# Patient Record
Sex: Female | Born: 1935 | Race: White | Hispanic: No | Smoking: Never smoker
Health system: Southern US, Community
[De-identification: ages and names within clinical notes are randomized; demographics above are authoritative.]

## PROBLEM LIST (undated history)

## (undated) DIAGNOSIS — M1990 Unspecified osteoarthritis, unspecified site: Secondary | ICD-10-CM

## (undated) DIAGNOSIS — Z87898 Personal history of other specified conditions: Secondary | ICD-10-CM

## (undated) DIAGNOSIS — J45909 Unspecified asthma, uncomplicated: Secondary | ICD-10-CM

## (undated) DIAGNOSIS — I499 Cardiac arrhythmia, unspecified: Secondary | ICD-10-CM

## (undated) DIAGNOSIS — H409 Unspecified glaucoma: Secondary | ICD-10-CM

## (undated) DIAGNOSIS — Z8619 Personal history of other infectious and parasitic diseases: Secondary | ICD-10-CM

## (undated) DIAGNOSIS — F419 Anxiety disorder, unspecified: Secondary | ICD-10-CM

## (undated) DIAGNOSIS — T7840XA Allergy, unspecified, initial encounter: Secondary | ICD-10-CM

## (undated) DIAGNOSIS — E785 Hyperlipidemia, unspecified: Secondary | ICD-10-CM

## (undated) DIAGNOSIS — K219 Gastro-esophageal reflux disease without esophagitis: Secondary | ICD-10-CM

## (undated) DIAGNOSIS — IMO0001 Reserved for inherently not codable concepts without codable children: Secondary | ICD-10-CM

## (undated) DIAGNOSIS — E7800 Pure hypercholesterolemia, unspecified: Secondary | ICD-10-CM

## (undated) DIAGNOSIS — G43909 Migraine, unspecified, not intractable, without status migrainosus: Secondary | ICD-10-CM

## (undated) HISTORY — DX: Allergy, unspecified, initial encounter: T78.40XA

## (undated) HISTORY — DX: Cardiac arrhythmia, unspecified: I49.9

## (undated) HISTORY — DX: Pure hypercholesterolemia, unspecified: E78.00

## (undated) HISTORY — PX: ABDOMINAL HYSTERECTOMY: SHX81

## (undated) HISTORY — DX: Hyperlipidemia, unspecified: E78.5

## (undated) HISTORY — DX: Unspecified asthma, uncomplicated: J45.909

## (undated) HISTORY — PX: TONSILLECTOMY: SUR1361

## (undated) HISTORY — DX: Gastro-esophageal reflux disease without esophagitis: K21.9

## (undated) HISTORY — DX: Unspecified osteoarthritis, unspecified site: M19.90

## (undated) HISTORY — DX: Anxiety disorder, unspecified: F41.9

## (undated) HISTORY — DX: Unspecified glaucoma: H40.9

## (undated) HISTORY — DX: Migraine, unspecified, not intractable, without status migrainosus: G43.909

## (undated) HISTORY — DX: Personal history of other specified conditions: Z87.898

## (undated) HISTORY — PX: GALLBLADDER SURGERY: SHX652

## (undated) HISTORY — DX: Reserved for inherently not codable concepts without codable children: IMO0001

---

## 2004-08-01 ENCOUNTER — Ambulatory Visit: Payer: Self-pay | Admitting: Unknown Physician Specialty

## 2004-08-04 ENCOUNTER — Ambulatory Visit: Payer: Self-pay | Admitting: Unknown Physician Specialty

## 2005-02-06 ENCOUNTER — Ambulatory Visit: Payer: Self-pay | Admitting: General Surgery

## 2005-08-14 ENCOUNTER — Ambulatory Visit: Payer: Self-pay | Admitting: General Surgery

## 2006-08-23 ENCOUNTER — Ambulatory Visit: Payer: Self-pay | Admitting: Unknown Physician Specialty

## 2006-11-29 ENCOUNTER — Ambulatory Visit: Payer: Self-pay | Admitting: Unknown Physician Specialty

## 2008-03-04 ENCOUNTER — Ambulatory Visit: Payer: Self-pay | Admitting: Unknown Physician Specialty

## 2008-05-11 ENCOUNTER — Other Ambulatory Visit: Payer: Self-pay

## 2008-05-11 ENCOUNTER — Emergency Department: Payer: Self-pay | Admitting: Internal Medicine

## 2009-05-17 ENCOUNTER — Ambulatory Visit: Payer: Self-pay | Admitting: Internal Medicine

## 2010-06-09 ENCOUNTER — Ambulatory Visit: Payer: Self-pay | Admitting: Internal Medicine

## 2011-07-05 ENCOUNTER — Ambulatory Visit: Payer: Self-pay | Admitting: Internal Medicine

## 2012-08-06 ENCOUNTER — Ambulatory Visit: Payer: Self-pay | Admitting: Internal Medicine

## 2014-04-06 ENCOUNTER — Ambulatory Visit: Payer: Self-pay | Admitting: Physician Assistant

## 2015-07-09 DIAGNOSIS — Z91040 Latex allergy status: Secondary | ICD-10-CM | POA: Insufficient documentation

## 2015-07-09 DIAGNOSIS — Z880 Allergy status to penicillin: Secondary | ICD-10-CM | POA: Diagnosis not present

## 2015-07-09 DIAGNOSIS — R197 Diarrhea, unspecified: Secondary | ICD-10-CM | POA: Insufficient documentation

## 2015-07-09 DIAGNOSIS — R112 Nausea with vomiting, unspecified: Secondary | ICD-10-CM | POA: Diagnosis not present

## 2015-07-09 DIAGNOSIS — R109 Unspecified abdominal pain: Secondary | ICD-10-CM | POA: Diagnosis not present

## 2015-07-09 DIAGNOSIS — Z79899 Other long term (current) drug therapy: Secondary | ICD-10-CM | POA: Diagnosis not present

## 2015-07-09 DIAGNOSIS — Z792 Long term (current) use of antibiotics: Secondary | ICD-10-CM | POA: Insufficient documentation

## 2015-07-09 LAB — CBC
HEMATOCRIT: 40.9 % (ref 35.0–47.0)
Hemoglobin: 13.9 g/dL (ref 12.0–16.0)
MCH: 31.4 pg (ref 26.0–34.0)
MCHC: 33.9 g/dL (ref 32.0–36.0)
MCV: 92.5 fL (ref 80.0–100.0)
Platelets: 257 10*3/uL (ref 150–440)
RBC: 4.43 MIL/uL (ref 3.80–5.20)
RDW: 13.6 % (ref 11.5–14.5)
WBC: 9.7 10*3/uL (ref 3.6–11.0)

## 2015-07-09 LAB — TROPONIN I: Troponin I: <0.03 ng/mL (ref <0.031)

## 2015-07-09 LAB — COMPREHENSIVE METABOLIC PANEL
ALBUMIN: 3.8 g/dL (ref 3.5–5.0)
ALT: 23 U/L (ref 14–54)
ANION GAP: 7 (ref 5–15)
AST: 25 U/L (ref 15–41)
Alkaline Phosphatase: 32 U/L — ABNORMAL LOW (ref 38–126)
BUN: 10 mg/dL (ref 6–20)
CO2: 24 mmol/L (ref 22–32)
Calcium: 9.1 mg/dL (ref 8.9–10.3)
Chloride: 107 mmol/L (ref 101–111)
Creatinine, Ser: 0.82 mg/dL (ref 0.44–1.00)
GFR calc non Af Amer: >60 mL/min (ref >60)
GLUCOSE: 109 mg/dL — AB (ref 65–99)
POTASSIUM: 3.3 mmol/L — AB (ref 3.5–5.1)
SODIUM: 138 mmol/L (ref 135–145)
Total Bilirubin: 0.8 mg/dL (ref 0.3–1.2)
Total Protein: 6.9 g/dL (ref 6.5–8.1)

## 2015-07-09 LAB — LIPASE, BLOOD: Lipase: 24 U/L (ref 22–51)

## 2015-07-09 NOTE — ED Notes (Signed)
Patient reports treated for same symptoms 2 weeks ago and woke last night with abdominal pain with nausea, vomiting and diarrhea.

## 2015-07-10 ENCOUNTER — Encounter: Payer: Self-pay | Admitting: Emergency Medicine

## 2015-07-10 ENCOUNTER — Emergency Department
Admission: EM | Admit: 2015-07-10 | Discharge: 2015-07-10 | Disposition: A | Discharge status: Home / Self Care | Payer: Medicare Other | Attending: Emergency Medicine | Admitting: Emergency Medicine

## 2015-07-10 DIAGNOSIS — R197 Diarrhea, unspecified: Secondary | ICD-10-CM

## 2015-07-10 HISTORY — DX: Personal history of other infectious and parasitic diseases: Z86.19

## 2015-07-10 LAB — C DIFFICILE QUICK SCREEN W PCR REFLEX
C DIFFICILE (CDIFF) TOXIN: POSITIVE — AB
C Diff antigen: POSITIVE — AB
C Diff interpretation: POSITIVE

## 2015-07-10 MED ORDER — POTASSIUM CHLORIDE CRYS ER 20 MEQ PO TBCR
40.0000 meq | EXTENDED_RELEASE_TABLET | Freq: Once | ORAL | Status: AC
  Administered 2015-07-10: 40 meq via ORAL
  Filled 2015-07-10: qty 2

## 2015-07-10 NOTE — Discharge Instructions (Signed)
As we discussed, we are very concerned about the possibility of a recurrent or previously incompletely treated C. difficile infection.  However, since you were not able to produce a stool sample while in the emergency department and you are feeling well with normal vital signs, we will discharge to home for outpatient follow-up.  We provided a written order for outpatient C. difficile testing.  Please take a sample to the lab and have it tested as soon as possible.  We recommend that you follow-up with Dr. Ouida Sills at the next available opportunity.  Please return immediately to the emergency department if you develop any new or worsening symptoms that concern you.   Diarrhea Diarrhea is frequent loose and watery bowel movements. It can cause you to feel weak and dehydrated. Dehydration can cause you to become tired and thirsty, have a dry mouth, and have decreased urination that often is dark yellow. Diarrhea is a sign of another problem, most often an infection that will not last long. In most cases, diarrhea typically lasts 2-3 days. However, it can last longer if it is a sign of something more serious. It is important to treat your diarrhea as directed by your caregiver to lessen or prevent future episodes of diarrhea. CAUSES  Some common causes include:  Gastrointestinal infections caused by viruses, bacteria, or parasites.  Food poisoning or food allergies.  Certain medicines, such as antibiotics, chemotherapy, and laxatives.  Artificial sweeteners and fructose.  Digestive disorders. HOME CARE INSTRUCTIONS  Ensure adequate fluid intake (hydration): Have 1 cup (8 oz) of fluid for each diarrhea episode. Avoid fluids that contain simple sugars or sports drinks, fruit juices, whole milk products, and sodas. Your urine should be clear or pale yellow if you are drinking enough fluids. Hydrate with an oral rehydration solution that you can purchase at pharmacies, retail stores, and online. You can  prepare an oral rehydration solution at home by mixing the following ingredients together:   - tsp table salt.   tsp baking soda.   tsp salt substitute containing potassium chloride.  1  tablespoons sugar.  1 L (34 oz) of water.  Certain foods and beverages may increase the speed at which food moves through the gastrointestinal (GI) tract. These foods and beverages should be avoided and include:  Caffeinated and alcoholic beverages.  High-fiber foods, such as raw fruits and vegetables, nuts, seeds, and whole grain breads and cereals.  Foods and beverages sweetened with sugar alcohols, such as xylitol, sorbitol, and mannitol.  Some foods may be well tolerated and may help thicken stool including:  Starchy foods, such as rice, toast, pasta, low-sugar cereal, oatmeal, grits, baked potatoes, crackers, and bagels.  Bananas.  Applesauce.  Add probiotic-rich foods to help increase healthy bacteria in the GI tract, such as yogurt and fermented milk products.  Wash your hands well after each diarrhea episode.  Only take over-the-counter or prescription medicines as directed by your caregiver.  Take a warm bath to relieve any burning or pain from frequent diarrhea episodes. SEEK IMMEDIATE MEDICAL CARE IF:   You are unable to keep fluids down.  You have persistent vomiting.  You have blood in your stool, or your stools are black and tarry.  You do not urinate in 6-8 hours, or there is only a small amount of very dark urine.  You have abdominal pain that increases or localizes.  You have weakness, dizziness, confusion, or light-headedness.  You have a severe headache.  Your diarrhea gets worse or does  not get better.  You have a fever or persistent symptoms for more than 2-3 days.  You have a fever and your symptoms suddenly get worse. MAKE SURE YOU:   Understand these instructions.  Will watch your condition.  Will get help right away if you are not doing well or  get worse. Document Released: 09/15/2002 Document Revised: 02/09/2014 Document Reviewed: 06/02/2012 [facility] Patient Information 2015 [place], [place]. This information is not intended to replace advice given to you by your health care provider. Make sure you discuss any questions you have with your health care provider.   Clostridium Difficile Toxin This is a test which may be done when a patient has diarrhea that lasts for several days, or has abdominal pain, fever, and nausea after antibiotic therapy.  This test looks for the presence of Clostridium difficile (C.diff.) toxin in a stool sample. C.diff. is a germ (bacterium) that is one of the groups of bacteria that are usually in the colon, called "normal flora." If something upsets the growth of the other normal flora, C.diff. may overgrow and disrupt the balance of bacteria in the colon. C. diff. may produce two toxins, A and B. The combination of overgrowth and toxins may cause prolonged diarrhea. The toxins may damage the lining of the colon and lead to colitis.  While some cases of C. diff. diarrhea and colitis do not require treatment, others require specific oral antibiotic therapy. Most patients improve as the normal flora re-establishes itself, but about some may have one or more relapses, with symptoms and detectible toxin levels coming back. PREPARATION FOR TEST There is no special preparation for the test. A fresh stool sample is collected in a sterile container. The sample should not be mixed with urine or water. The stool should be taken to the lab within an hour. It may be refrigerated or frozen and taken to the lab as soon as possible. The container should be labeled with your name and the date and time of the stool collection.  NORMAL FINDINGS Negative Tissue Culture (no toxin identified) Ranges for normal findings may vary among different laboratories and hospitals. You should always check with your doctor after having lab work or  other tests done to discuss the meaning of your test results and whether your values are considered within normal limits. MEANING OF TEST  Your caregiver will go over the test results with you and discuss the importance and meaning of your results, as well as treatment options and the need for additional tests if necessary. OBTAINING THE TEST RESULTS It is your responsibility to obtain your test results. Ask the lab or department performing the test when and how you will get your results. Document Released: 10/18/2004 Document Revised: 12/18/2011 Document Reviewed: 09/03/2008 [facility] Patient Information 2015 [place], [place]. This information is not intended to replace advice given to you by your health care provider. Make sure you discuss any questions you have with your health care provider.

## 2015-07-10 NOTE — ED Provider Notes (Signed)
[facility] Emergency Department Provider Note  ____________________________________________  Time seen: Approximately 2:02 AM  I have reviewed the triage vital signs and the nursing notes.   HISTORY  Chief Complaint Nausea; Emesis; and Diarrhea    HPI Andrea Hester is a 81 y.o. female who is quite healthy for her age and takes no medications to largely to numerous reported medication allergies.  She resents to the emergency department today with about 24 hours of frequent, foul-smelling, watery stools.  She reports that she had the same symptoms 2 weeks ago and went to urgent care where they tested for C. difficile which was positive.  She completed a 10 day course of metronidazole and said that the symptoms resolved, but that they started up again about 24 hours ago and feel the same as prior.  She is having intermittent cramping abdominal pain but it is mild to moderate.  She denies nausea, vomiting, chest pain, shortness of breath, fever/chills, dysuria.  Has had 6-8 loose stools today.  She does not take any medications but she did take 2 doses of an antibiotic prior to her last episode of C. difficile due to a dental procedure before she stopped taking it due to perceived medication reactions.  She developed a C. difficile after that.  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 difficile.   Past Medical History  Diagnosis Date  . H/O Clostridium difficile infection     There are no active problems to display for this patient.   History reviewed. No pertinent past surgical history.  Current Outpatient Rx  Name  Route  Sig  Dispense  Refill  . clindamycin (CLEOCIN) 300 MG capsule   Oral   Take 1 capsule by mouth 3 (three) times daily.      0   . diltiazem (CARDIZEM) 30 MG tablet   Oral  Take 1 tablet by mouth daily.         . furosemide (LASIX) 20 MG tablet   Oral   Take 1 tablet by mouth daily.         . metroNIDAZOLE (FLAGYL) 500 MG tablet   Oral   Take 500 mg by mouth 3 (three) times daily.      0   . potassium chloride (K-DUR) 10 MEQ tablet   Oral   Take 1 tablet by mouth daily.           Allergies Prednisone; Cardizem; Ciprofloxacin; Clindamycin/lincomycin; Macrolides and ketolides; Omnicef; Penicillins; Prilosec; Reglan; Statins; Sulfa antibiotics; Epinephrine; and Latex  History reviewed. No pertinent family history.  Social History Social History  Substance Use Topics  . Smoking status: Never Smoker   . Smokeless tobacco: None  . Alcohol Use: No    Review of Systems Constitutional: No fever/chills Eyes: No visual changes. ENT: No sore throat. Cardiovascular: Denies chest pain. Respiratory: Denies shortness of breath. Gastrointestinal: Intermittent cramping lower abdominal pain with multiple episodes of foul-smelling diarrhea over the last 24 hours, similar to her C. difficile infection 2 weeks ago Genitourinary: Negative for dysuria. Musculoskeletal: Negative for back pain. Skin: Negative for rash. Neurological: Negative for headaches, focal weakness or numbness.  10-point ROS otherwise negative.  ____________________________________________   PHYSICAL EXAM:  VITAL SIGNS: ED Triage Vitals  Enc Vitals Group     BP 07/09/15 2203 115/57 mmHg  Pulse Rate 07/09/15 2203 93     Resp 07/09/15 2203 18     Temp 07/09/15 2203 98.5 F (36.9 C)     Temp Source 07/09/15 2203 Oral     SpO2 07/09/15 2203 98 %     Weight 07/09/15 2204 132 lb (59.875 kg)     Height 07/09/15 2204 5\' 6"  (1.676 m)     Head Cir --      Peak Flow --      Pain Score --      Pain Loc --      Pain Edu? --      Excl. in [place]? --     Constitutional: Alert and oriented. Well appearing and in no acute distress.  Appears younger than chronological age. Eyes:  Conjunctivae are normal. PERRL. EOMI. Head: Atraumatic. Nose: No congestion/rhinnorhea. Mouth/Throat: Mucous membranes are moist.  Oropharynx non-erythematous. Neck: No stridor.   Cardiovascular: Normal rate, regular rhythm. Grossly normal heart sounds.  Good peripheral circulation. Respiratory: Normal respiratory effort.  No retractions. Lungs CTAB. Gastrointestinal: Soft and nontender. No distention. No abdominal bruits. No CVA tenderness. Musculoskeletal: No lower extremity tenderness nor edema.  No joint effusions. Neurologic:  Normal speech and language. No gross focal neurologic deficits are appreciated.  Skin:  Skin is warm, dry and intact. No rash noted. Psychiatric: Mood and affect are normal. Speech and behavior are normal.  ____________________________________________   LABS (all labs ordered are listed, but only abnormal results are displayed)  Labs Reviewed  COMPREHENSIVE METABOLIC PANEL - Abnormal; Notable for the following:    Potassium 3.3 (*)    Glucose, Bld 109 (*)    Alkaline Phosphatase 32 (*)    All other components within normal limits  LIPASE, BLOOD  CBC  TROPONIN I   ____________________________________________  EKG  ED ECG REPORT I, Morrissa Shein, the attending physician, personally viewed and interpreted this ECG.  Date: 07/10/2015 EKG Time: 01:06 Rate: 69 Rhythm: normal sinus rhythm QRS Axis: normal Intervals: normal ST/T Wave abnormalities: normal Conduction Disutrbances: none Narrative Interpretation: unremarkable  ____________________________________________  RADIOLOGY   No results found.  ____________________________________________   PROCEDURES  Procedure(s) performed: None  Critical Care performed: No ____________________________________________   INITIAL IMPRESSION / ASSESSMENT AND PLAN / ED COURSE  Pertinent labs & imaging results that were available during my care of the patient were reviewed by me and considered in  my medical decision making (see chart for details).  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  I discussed this with her and her family.  I stressed the need for her to provide a sample for [place] tonight swelling over the should start her act on Flagyl versus vancomycin by mouth.  She does not need IV antibiotics at this time and her labs are all reassuring other than a slightly low potassium which I will replete by mouth.  ----------------------------------------- 4:31 AM on 07/10/2015 -----------------------------------------  The patient has been unable to produce a stool sample while in the emergency department; she had one bowel movement that was "explosive" but did not produce enough specimen to be sent to the lab.  She remains stable with normal vital signs.  After discussion with her and her family, I will discharge her with collection equipment and a hand written order for an outpatient C. difficile test.  I discussed with them how to proceed getting this test done and following up with Dr. Ouida Sills in clinic.  ____________________________________________  FINAL  CLINICAL IMPRESSION(S) / ED DIAGNOSES  Final diagnoses:  Acute diarrhea      NEW MEDICATIONS STARTED DURING THIS VISIT:  Discharge Medication List as of 07/10/2015  4:35 AM       Hinda Kehr, MD 07/10/15 [phone]

## 2015-07-10 NOTE — ED Notes (Signed)

## 2015-08-06 ENCOUNTER — Other Ambulatory Visit
Admission: RE | Admit: 2015-08-06 | Discharge: 2015-08-06 | Disposition: A | Discharge status: Home / Self Care | Payer: Medicare Other | Source: Ambulatory Visit | Attending: Unknown Physician Specialty | Admitting: Unknown Physician Specialty

## 2015-08-06 DIAGNOSIS — A047 Enterocolitis due to Clostridium difficile: Secondary | ICD-10-CM | POA: Diagnosis present

## 2015-08-06 LAB — C DIFFICILE QUICK SCREEN W PCR REFLEX
C Diff antigen: POSITIVE — AB
C Diff interpretation: POSITIVE
C Diff toxin: POSITIVE — AB

## 2016-05-18 ENCOUNTER — Ambulatory Visit: Payer: Medicare Other | Attending: Internal Medicine

## 2016-05-18 VITALS — BP 165/64 | HR 83

## 2016-05-18 DIAGNOSIS — M25562 Pain in left knee: Secondary | ICD-10-CM | POA: Diagnosis present

## 2016-05-18 DIAGNOSIS — M25561 Pain in right knee: Secondary | ICD-10-CM | POA: Diagnosis not present

## 2016-05-18 DIAGNOSIS — R262 Difficulty in walking, not elsewhere classified: Secondary | ICD-10-CM | POA: Insufficient documentation

## 2016-05-18 DIAGNOSIS — M6281 Muscle weakness (generalized): Secondary | ICD-10-CM | POA: Diagnosis present

## 2016-05-18 NOTE — Patient Instructions (Signed)
  Recommended for pt to perform knee extensions (AROM) in a pain free range prior to standing up from sitting to help decrease knee discomfort when performing transfer.     Recommended patient to keep her knees/thighs in neutral when she goes up and down her steps at home (holding onto both rails for safety).    Pt demonstrated and verbalized understanding.

## 2016-05-18 NOTE — Therapy (Signed)
[facility] [address] [place], [address] Phone: [phone]   Fax:  [phone]  Physical Therapy Evaluation  Patient Details  Name: Andrea Hester MRN: [id number] Date of Birth: 1933-08-22 Referring Provider: Governor Specking III, MD  Encounter Date: 05/18/2016      PT End of Session - 05/18/16 1521    Visit Number 1   Number of Visits 13   Date for PT Re-Evaluation 06/29/16   Authorization Type 1   Authorization Time Period of 10   PT Start Time 1521   PT Stop Time 1613   PT Time Calculation (min) 52 min   Equipment Utilized During Treatment Gait belt   Activity Tolerance Patient tolerated treatment well   Behavior During Therapy WFL for tasks assessed/performed      Past Medical History:  Diagnosis Date  . Allergy   . GERD (gastroesophageal reflux disease)   . H/O Clostridium difficile infection   . History of palpitations   . Hyperlipidemia   . Migraines   . White coat hypertension     Past Surgical History:  Procedure Laterality Date  . GALLBLADDER SURGERY    . TONSILLECTOMY      Vitals:   05/18/16 1529  BP: (!) 165/64  Pulse: 83         Subjective Assessment - 05/18/16 1532    Subjective R knee pain: 6-7/10 at worst; L knee 5/10 at worst   Pertinent History Bilateral knee pain. Pain began about 4 years ago when pt was going up and down the stairs. Used biofreeze which helped for about a year. 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 Pt states that her knees progressively worsened. Pt also has difficulty going up and down stairs to do laundry due to her knee pain.  Has not had any imaging for her knees.  Pt also states she might have pulled a muscle behind her R thigh from a balance exercise (standing and leaning back) that she did.    Patient Stated Goals Be able to get out and get to church. Walk better.    Currently in Pain? Yes   Pain Location Knee   Pain Orientation Right;Left   Pain Descriptors / Indicators Stabbing   Pain Type Chronic pain   Pain Onset More than a month ago   Aggravating Factors  standing up from sitting (pain and stiffness) at least 15 min, stair negotiation, squatting, pressing on the gas pedal with her R foot.    Pain Relieving Factors moving around, medication, ice (better than the heat)            [facility] PT Assessment - 05/18/16 1523      Assessment   Medical Diagnosis Chronic pain of both knees   Referring Provider Bolivar Haw, MD   Onset Date/Surgical Date 05/03/16  Date PT referral signed. Chronic pain since 4 years ago.   Prior Therapy No known physical therapy for current condition.     Precautions   Precaution Comments No known precautions.      Restrictions   Other Position/Activity Restrictions No known restrictions.     Balance Screen   Has the patient fallen in the past 6 months No   Has the patient had a decrease in activity level because of a fear of falling?  No  Pt states fear of falling.    Is the patient reluctant  to leave their home because of a fear of falling?  No  Pt states fear of falling.      Home Environment   Additional Comments Pt lives alone in a 2 story home, 3 steps to enter, no rail; 14 steps to get to second floor with bilateral rails     Prior Function   Vocation Other (comment)  homemaker   Vocation Requirements PLOF: better able to stand up from sitting, negotiate stairs, squat with less knee pain     Observation/Other Assessments   Observations Discomfort R proximal sciatic nerve area with S/L R hip flexion. Pt states hx of R sciatica. R LE SLR hip flexion reproduced R sciatic symptoms which decreased with ankle PF.  Ascending and descending strairs: decreased L femoral control going down, increased R hip abduction/ER when stepping down with L LE   Lower Extremity Functional Scale  26/80      Posture/Postural Control   Posture Comments slight L foot pronation, R lateral shift, bilaterally protracted shoulders and neck, slight backward lean     Strength   Right Hip Flexion 4-/5   Right Hip ABduction 4/5   Left Hip Flexion 4/5   Left Hip ABduction 4/5   Right Knee Flexion 4/5   Right Knee Extension 5/5   Left Knee Flexion 4+/5   Left Knee Extension 5/5     Ambulation/Gait   Gait Comments Antalgic, decreased stance L LE (when not using SPC on R side), R lateral lean during R LE stance phase. Uses SPC on R side.              Objectives   There-ex   Directed patient with seated knee extensions prior to standing from sitting.   Ascending and descendign 4 regular steps 2x with bilateral UE assist, emphasis on netural femoral positiong and glute max use    Decreased knee pain with stair negotiation  Reviewed HEP. Pt demonstrated and verbalized understanding. Please see patient instructions.   Improved exercise technique, movement at target joints, use of target muscles after mod verbal, visual, tactile cues.          PT Education - 05/18/16 1937    Education provided Yes   Education Details ther-ex, HEP, plan of care   Person(s) Educated Patient   Methods Explanation;Demonstration;Tactile cues;Verbal cues   Comprehension Returned demonstration;Verbalized understanding             PT Long Term Goals - 05/18/16 1927      PT LONG TERM GOAL #1   Title Patient will have a decrease in R knee pain to 3/10 or less at worst and L knee pain to 2/10 or less at worst to promote ability to stand up from a chair, ascend and descend stairs, and pick up items from the ground.    Baseline Pain at worst: R knee 7/10, L knee 5/10   Time 6   Period Weeks   Status New     PT LONG TERM GOAL #2   Title Patient will improve her LEFS score by at least 9 points as a demonstration of improved function.    Baseline 26/80   Time 6   Period Weeks   Status New     PT  LONG TERM GOAL #3   Title Patient will improve bilateral hip abduction and knee flexion strength by at least 1/2 MMT grade to promote ability to negotiate stairs and pick up items from the floor.    Time 6  Period Weeks   Status New     PT LONG TERM GOAL #4   Title Patient will be able to ascend and descend 4 regular steps 3 times with bilateral UE assist with minimal to no complain of knee pain to promote ability to go up and down her stairs at home.    Baseline Increased bilateral knee pain with stair negotiation.    Time 6   Period Weeks   Status New               Plan - 05/18/16 1917    Clinical Impression Statement Patient is an 82 year old female who came to physical therapy secondary to bilateral knee pain. She also presents with altered gait pattern and posture, bilateral knee stiffness, altered mechanics when negotiating stairs, bilateral hip and knee weakness, and difficulty performing functional tasks such as standing up from prolonged sitting, stair negotiatin, and picking up items from the floor. Patient will benefit from skilled physical therapy services to address the aforementioned deficits.    Rehab Potential Good   Clinical Impairments Affecting Rehab Potential Chronicity of condition, age   PT Frequency 2x / week   PT Duration 6 weeks   PT Treatment/Interventions Therapeutic exercise;Manual techniques;Aquatic Therapy;Ultrasound;Advice worker;Therapeutic activities;Patient/family education;Neuromuscular re-education;Dry needling   PT Next Visit Plan femoral control, mechanics with stair negotiation, movement, hip strengthening   Consulted and Agree with Plan of Care Patient      Patient will benefit from skilled therapeutic intervention in order to improve the following deficits and impairments:  Pain, Improper body mechanics, Decreased strength  Visit Diagnosis: Pain in right knee - Plan: PT plan of care cert/re-cert  Pain in left knee  - Plan: PT plan of care cert/re-cert  Muscle weakness (generalized) - Plan: PT plan of care cert/re-cert  Difficulty in walking, not elsewhere classified - Plan: PT plan of care cert/re-cert      G-Codes - [id number]    Functional Assessment Tool Used LEFS, clinical presentation, patient interview   Functional Limitation Mobility: Walking and moving around   Mobility: Walking and Moving Around Current Status [id number]) At least 60 percent but less than 80 percent impaired, limited or restricted   Mobility: Walking and Moving Around Goal Status [id number]) At least 20 percent but less than 40 percent impaired, limited or restricted       Problem List There are no active problems to display for this patient.   Thank you for your referral.  Joneen Boers PT, DPT   05/18/2016, 8:07 PM  [facility] [address], [place], [address] Phone: [phone]   Fax:  [phone]  Name: VINCY TARVIN MRN: [id number] Date of Birth: 05-Jun-1933

## 2016-05-24 ENCOUNTER — Ambulatory Visit: Payer: Medicare Other | Admitting: Physical Therapy

## 2016-05-24 DIAGNOSIS — M25561 Pain in right knee: Secondary | ICD-10-CM

## 2016-05-24 DIAGNOSIS — M25562 Pain in left knee: Secondary | ICD-10-CM

## 2016-05-24 DIAGNOSIS — M6281 Muscle weakness (generalized): Secondary | ICD-10-CM

## 2016-05-24 DIAGNOSIS — R262 Difficulty in walking, not elsewhere classified: Secondary | ICD-10-CM

## 2016-05-24 NOTE — Therapy (Signed)
[facility] [address], [place], [address] Phone: [phone]   Fax:  [phone]  Physical Therapy Treatment  Patient Details  Name: Andrea Hester MRN: [id number] Date of Birth: 1933/05/23 Referring Provider: Governor Specking III, MD  Encounter Date: 05/24/2016      PT End of Session - 05/24/16 1036    Visit Number 2   Number of Visits 13   Date for PT Re-Evaluation 06/29/16   Authorization Type 2   Authorization Time Period of 10   PT Start Time 1030   PT Stop Time 1110   PT Time Calculation (min) 40 min   Equipment Utilized During Treatment Gait belt   Activity Tolerance Patient tolerated treatment well   Behavior During Therapy WFL for tasks assessed/performed      Past Medical History:  Diagnosis Date  . Allergy   . GERD (gastroesophageal reflux disease)   . H/O Clostridium difficile infection   . History of palpitations   . Hyperlipidemia   . Migraines   . White coat hypertension     Past Surgical History:  Procedure Laterality Date  . GALLBLADDER SURGERY    . TONSILLECTOMY      There were no vitals filed for this visit.      Subjective Assessment - 05/24/16 1032    Subjective Pt reports her R sciatic pain is bothering her more than the knee pain. She reports extending her knees before standing has helped.   Pertinent History Bilateral knee pain. Pain began about 4 years ago when pt was going up and down the stairs. Used biofreeze which helped for about a year. 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 Pt states that her knees progressively worsened. Pt also has difficulty going up and down stairs to do laundry due to her knee pain.  Has not had any imaging for her knees.  Pt also states she might have pulled a muscle behind her R thigh from a balance exercise (standing and leaning back) that she did.    Patient Stated  Goals Be able to get out and get to church. Walk better.    Currently in Pain? Yes   Pain Score 6   R sciatic    Pain Onset More than a month ago      There-ex     Standing hip SLR, abd, ext, march with YTB 3x10 Eccentric forward step downs 2x10 each LAQs with 2# 2x10 Leg press plate [id number]  Supine: Heel slides 2x10 each Hooklying clamshells with YTB 2x10  Mod cues for proper technique of exercises to target specific muscles and slow eccentric contractions for most effective strengthening.                            PT Education - 05/24/16 1035    Education provided Yes   Education Details staying active, DOMS   Person(s) Educated Patient   Methods Explanation   Comprehension Verbalized understanding             PT Long Term Goals - 05/18/16 1927      PT LONG TERM GOAL #1   Title Patient will have a decrease in R knee pain to 3/10 or less at worst and L knee pain to 2/10 or less at worst to promote ability to stand up from a chair, ascend and descend  stairs, and pick up items from the ground.    Baseline Pain at worst: R knee 7/10, L knee 5/10   Time 6   Period Weeks   Status New     PT LONG TERM GOAL #2   Title Patient will improve her LEFS score by at least 9 points as a demonstration of improved function.    Baseline 26/80   Time 6   Period Weeks   Status New     PT LONG TERM GOAL #3   Title Patient will improve bilateral hip abduction and knee flexion strength by at least 1/2 MMT grade to promote ability to negotiate stairs and pick up items from the floor.    Time 6   Period Weeks   Status New     PT LONG TERM GOAL #4   Title Patient will be able to ascend and descend 4 regular steps 3 times with bilateral UE assist with minimal to no complain of knee pain to promote ability to go up and down her stairs at home.    Baseline Increased bilateral knee pain with stair negotiation.    Time 6   Period Weeks   Status New                Plan - 05/24/16 1058    Clinical Impression Statement Pt was able to perform LE strengthening exercises with mild increase in R sciatic and knee pain. Eased with rest. She demonstrated B muscles fatigue requiring occasional therapeutic rest breaks. She will benefit from continued strengthening to improve joint stability and functional mobility.   Rehab Potential Good   Clinical Impairments Affecting Rehab Potential Chronicity of condition, age   PT Frequency 2x / week   PT Duration 6 weeks   PT Treatment/Interventions Therapeutic exercise;Manual techniques;Aquatic Therapy;Ultrasound;Advice worker;Therapeutic activities;Patient/family education;Neuromuscular re-education;Dry needling   PT Next Visit Plan femoral control, mechanics with stair negotiation, movement, hip strengthening, HEP   Consulted and Agree with Plan of Care Patient      Patient will benefit from skilled therapeutic intervention in order to improve the following deficits and impairments:  Pain, Improper body mechanics, Decreased strength  Visit Diagnosis: Pain in right knee  Pain in left knee  Muscle weakness (generalized)  Difficulty in walking, not elsewhere classified     Problem List There are no active problems to display for this patient.  Kimberlynn Lumbra Shiela Mayer, PT, DPT  05/24/16, 11:11 AM [facility] [address], [place], [address] Phone: [phone]   Fax:  [phone]  Name: Andrea Hester MRN: [id number] Date of Birth: [birth year]

## 2016-05-26 ENCOUNTER — Ambulatory Visit: Payer: Medicare Other | Admitting: Physical Therapy

## 2016-05-26 DIAGNOSIS — M25561 Pain in right knee: Secondary | ICD-10-CM | POA: Diagnosis not present

## 2016-05-26 DIAGNOSIS — M25562 Pain in left knee: Secondary | ICD-10-CM

## 2016-05-26 DIAGNOSIS — R262 Difficulty in walking, not elsewhere classified: Secondary | ICD-10-CM

## 2016-05-26 DIAGNOSIS — M6281 Muscle weakness (generalized): Secondary | ICD-10-CM

## 2016-05-26 NOTE — Patient Instructions (Signed)
HEP for sciatic nerve gliding: supine KTC with knee extension and flexion; KTC with knee extended with ankle pumps 2x10

## 2016-05-26 NOTE — Therapy (Signed)
[facility] [address] [place], [address] Phone: [phone]   Fax:  [phone]  Physical Therapy Treatment  Patient Details  Name: Andrea Hester MRN: [id number] Date of Birth: 1933-08-24 Referring Provider: Governor Specking III, MD  Encounter Date: 05/26/2016      PT End of Session - 05/26/16 1055    Visit Number 3   Number of Visits 13   Date for PT Re-Evaluation 06/29/16   Authorization Type 3   Authorization Time Period of 10   PT Start Time 1048   PT Stop Time 1128   PT Time Calculation (min) 40 min   Equipment Utilized During Treatment Gait belt   Activity Tolerance Patient tolerated treatment well   Behavior During Therapy WFL for tasks assessed/performed      Past Medical History:  Diagnosis Date  . Allergy   . GERD (gastroesophageal reflux disease)   . H/O Clostridium difficile infection   . History of palpitations   . Hyperlipidemia   . Migraines   . White coat hypertension     Past Surgical History:  Procedure Laterality Date  . GALLBLADDER SURGERY    . TONSILLECTOMY      There were no vitals filed for this visit.      Subjective Assessment - 05/26/16 1052    Subjective Pt reports she continues to ahve R sided sciatic pain. She also had some mild L groin pain after last session, but has gone away.   Pertinent History Bilateral knee pain. Pain began about 4 years ago when pt was going up and down the stairs. Used biofreeze which helped for about a year. 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 Pt states that her knees progressively worsened. Pt also has difficulty going up and down stairs to do laundry due to her knee pain.  Has not had any imaging for her knees.  Pt also states she might have pulled a muscle behind her R thigh from a balance exercise (standing and leaning back) that she did.    Patient Stated Goals  Be able to get out and get to church. Walk better.    Currently in Pain? Yes  R sciatic   Pain Score 6    Pain Onset More than a month ago      There-ex    Nustep L1 x3 min, L3 x5 minutes, L1 x2 minutes, cues for pushing through heels for most effective strengthening, and increased SPM during L3 Reduced R sciatic pain from 6/10 to 5/10  Supine SLR (-) bilaterally Slump test (+) R LE Seated R LE sciatic nerve glides 2x10 with ankle pump Supine R KTC with knee ext for nerve glide 2x10 Supine R KTC with knee extended with ankle pumps 2x10 Reduced R sciatic pain from 5/10 to 3/10  HEP provided for sciatic nerve gliding. See pt instructions.  Standing hip SLR, abd, ext, with YTB x10     Mod cues for proper technique of exercises to target specific muscles and slow eccentric contractions for most effective strengthening.                            PT Education - 05/26/16 1128    Education provided Yes   Education Details HEP for sciative nerve gliding   Person(s) Educated Patient   Methods Explanation;Demonstration;Handout   Comprehension Verbalized understanding;Returned  demonstration             PT Long Term Goals - 05/18/16 1927      PT LONG TERM GOAL #1   Title Patient will have a decrease in R knee pain to 3/10 or less at worst and L knee pain to 2/10 or less at worst to promote ability to stand up from a chair, ascend and descend stairs, and pick up items from the ground.    Baseline Pain at worst: R knee 7/10, L knee 5/10   Time 6   Period Weeks   Status New     PT LONG TERM GOAL #2   Title Patient will improve her LEFS score by at least 9 points as a demonstration of improved function.    Baseline 26/80   Time 6   Period Weeks   Status New     PT LONG TERM GOAL #3   Title Patient will improve bilateral hip abduction and knee flexion strength by at least 1/2 MMT grade to promote ability to negotiate stairs and pick up items from the  floor.    Time 6   Period Weeks   Status New     PT LONG TERM GOAL #4   Title Patient will be able to ascend and descend 4 regular steps 3 times with bilateral UE assist with minimal to no complain of knee pain to promote ability to go up and down her stairs at home.    Baseline Increased bilateral knee pain with stair negotiation.    Time 6   Period Weeks   Status New               Plan - 05/26/16 1128    Clinical Impression Statement Pt reported reduced R sciatic pain with nustep and after nerve gliding exercises form 6/10 to 3/10. She reported it feels much better. She was then able to perform LE strengthening exercises with no difficulties. She will benefit from continued stretching and strengthening of LEs to progress towards functional goals.   Rehab Potential Good   Clinical Impairments Affecting Rehab Potential Chronicity of condition, age   PT Frequency 2x / week   PT Duration 6 weeks   PT Treatment/Interventions Therapeutic exercise;Manual techniques;Aquatic Therapy;Ultrasound;Advice worker;Therapeutic activities;Patient/family education;Neuromuscular re-education;Dry needling   PT Next Visit Plan femoral control, mechanics with stair negotiation, movement, hip strengthening, HEP   Consulted and Agree with Plan of Care Patient      Patient will benefit from skilled therapeutic intervention in order to improve the following deficits and impairments:  Pain, Improper body mechanics, Decreased strength  Visit Diagnosis: Pain in right knee  Pain in left knee  Muscle weakness (generalized)  Difficulty in walking, not elsewhere classified     Problem List There are no active problems to display for this patient.  Neoma Laming, PT, DPT  05/26/16, 11:35 AM [facility] [address], [place], [address] Phone: [phone]   Fax:   [phone]  Name: Andrea Hester MRN: [id number] Date of Birth: June 03, 1933

## 2016-05-29 ENCOUNTER — Ambulatory Visit: Payer: Medicare Other

## 2016-06-01 ENCOUNTER — Ambulatory Visit: Payer: Medicare Other

## 2016-06-01 DIAGNOSIS — R262 Difficulty in walking, not elsewhere classified: Secondary | ICD-10-CM

## 2016-06-01 DIAGNOSIS — M25562 Pain in left knee: Secondary | ICD-10-CM

## 2016-06-01 DIAGNOSIS — M25561 Pain in right knee: Secondary | ICD-10-CM | POA: Diagnosis not present

## 2016-06-01 DIAGNOSIS — M6281 Muscle weakness (generalized): Secondary | ICD-10-CM

## 2016-06-01 NOTE — Patient Instructions (Signed)
Reviewed and given forward step up with bilateral UE assist onto 3 inch step 5x3 each LE with emphasis on femoral control as part of her HEP. Pt demonstrated and verbalized understanding.

## 2016-06-01 NOTE — Therapy (Signed)
[facility] [address] [place], [address] Phone: [phone]   Fax:  [phone]  Physical Therapy Treatment  Patient Details  Name: Andrea Hester MRN: [id number] Date of Birth: 05/28/1933 Referring Provider: Governor Specking III, MD  Encounter Date: 06/01/2016      PT End of Session - 06/01/16 1541    Visit Number 4   Number of Visits 13   Date for PT Re-Evaluation 06/29/16   Authorization Type 4   Authorization Time Period of 10   PT Start Time [id number]   PT Stop Time 1635   PT Time Calculation (min) 54 min   Equipment Utilized During Treatment Gait belt   Activity Tolerance Patient tolerated treatment well   Behavior During Therapy [facility] for tasks assessed/performed      Past Medical History:  Diagnosis Date  . Allergy   . GERD (gastroesophageal reflux disease)   . H/O Clostridium difficile infection   . History of palpitations   . Hyperlipidemia   . Migraines   . White coat hypertension     Past Surgical History:  Procedure Laterality Date  . GALLBLADDER SURGERY    . TONSILLECTOMY      There were no vitals filed for this visit.      Subjective Assessment - 06/01/16 1541    Subjective Knees are pretty much the same. The neural glide for her R calf seems to help for a while but symptoms come back. R knee 5/10, R calf is 6/10, L knee 5/10 currently.    Pertinent History Bilateral knee pain. Pain began about 4 years ago when pt was going up and down the stairs. Used biofreeze which helped for about a year. 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 Pt states that her knees progressively worsened. Pt also has difficulty going up and down stairs to do laundry due to her knee pain.  Has not had any imaging for her knees.  Pt also states she might have pulled a muscle behind her R thigh from a balance exercise (standing and leaning back)  that she did.    Patient Stated Goals Be able to get out and get to church. Walk better.    Currently in Pain? Yes   Pain Score 6   5-6/10   Pain Onset More than a month ago               Objectives  Manual therapy  Supine medial glide L and R patella grade 3  Decreased bilateral knee discomfort     There-ex   Directed patient with with seated R LE neural floss 10x2. Slight decrease in R sciatic nerve symptoms. Patellar crepitus palpated.   Seated bilateral hip adduction squeeze 10x5 seconds for 3 sets. Slight R anterior thigh discomfort. Decreased R sciatic symptoms.   SLS with opposite LE on 1st step with glute max squeeze 10x5 seconds for 2 sets. Slight increase in sciatic symptoms in gastroc area.  Forward step up onto 3 inch step 5x3 each LE with emphasis on femoral control  Reviewed and given as part of her HEP (with bilateral UE assist). Pt demonstrated and verbalized understanding.   Gait 32 ft x 2 without use of AD, CGA, emphasis on increasing step length to decrease shuffling. Improved gait pattern afterwards.    Improved exercise technique, movement at target joints, use of target muscles after mod verbal, visual,  tactile cues.                PT Education - 06/01/16 1608    Education provided Yes   Education Details ther-ex   [employer]) Educated Patient   Methods Explanation;Demonstration;Tactile cues;Verbal cues   Comprehension Returned demonstration;Verbalized understanding             PT Long Term Goals - 05/18/16 1927      PT LONG TERM GOAL #1   Title Patient will have a decrease in R knee pain to 3/10 or less at worst and L knee pain to 2/10 or less at worst to promote ability to stand up from a chair, ascend and descend stairs, and pick up items from the ground.    Baseline Pain at worst: R knee 7/10, L knee 5/10   Time 6   Period Weeks   Status New     PT LONG TERM GOAL #2   Title Patient will improve her LEFS score by at  least 9 points as a demonstration of improved function.    Baseline 26/80   Time 6   Period Weeks   Status New     PT LONG TERM GOAL #3   Title Patient will improve bilateral hip abduction and knee flexion strength by at least 1/2 MMT grade to promote ability to negotiate stairs and pick up items from the floor.    Time 6   Period Weeks   Status New     PT LONG TERM GOAL #4   Title Patient will be able to ascend and descend 4 regular steps 3 times with bilateral UE assist with minimal to no complain of knee pain to promote ability to go up and down her stairs at home.    Baseline Increased bilateral knee pain with stair negotiation.    Time 6   Period Weeks   Status New               Plan - 06/01/16 1609    Clinical Impression Statement Crepitus palpated with knee extension R side without pain. Decreased R sciatic symptoms with hip adduction squeeze exercise but R anterior thigh discomfort afterwards. Decreased bilateral knee pain and discomfort after joint mobilization to bilateral patellae. Improved femoral control with forward step up onto step after min cues. Improved step length with decreased shuffling with gait without use of AD, but CGA after cues. Pt tolerated session well without aggravation of symptoms.    Rehab Potential Good   Clinical Impairments Affecting Rehab Potential Chronicity of condition, age   PT Frequency 2x / week   PT Duration 6 weeks   PT Treatment/Interventions Therapeutic exercise;Manual techniques;Aquatic Therapy;Ultrasound;Advice worker;Therapeutic activities;Patient/family education;Neuromuscular re-education;Dry needling   PT Next Visit Plan femoral control, mechanics with stair negotiation, movement, hip strengthening, HEP   Consulted and Agree with Plan of Care Patient      Patient will benefit from skilled therapeutic intervention in order to improve the following deficits and impairments:  Pain, Improper body  mechanics, Decreased strength  Visit Diagnosis: Pain in right knee  Pain in left knee  Muscle weakness (generalized)  Difficulty in walking, not elsewhere classified     Problem List There are no active problems to display for this patient.   Joneen Boers PT, DPT   06/01/2016, 7:20 PM  [facility] [address], [place], [address] Phone: [phone]   Fax:  [phone]  Name: Feliciti Gentry  [place] MRN: [id number] Date of Birth: [birth year]

## 2016-06-07 ENCOUNTER — Ambulatory Visit: Payer: Medicare Other

## 2016-06-15 ENCOUNTER — Ambulatory Visit: Payer: Medicare Other

## 2016-06-19 ENCOUNTER — Ambulatory Visit: Payer: Medicare Other

## 2016-06-21 ENCOUNTER — Ambulatory Visit: Payer: Medicare Other

## 2016-06-26 ENCOUNTER — Ambulatory Visit: Payer: Medicare Other | Attending: Orthopedic Surgery

## 2016-06-26 ENCOUNTER — Telehealth: Payer: Self-pay

## 2016-06-26 DIAGNOSIS — M25561 Pain in right knee: Secondary | ICD-10-CM | POA: Insufficient documentation

## 2016-06-26 DIAGNOSIS — M25562 Pain in left knee: Secondary | ICD-10-CM | POA: Insufficient documentation

## 2016-06-26 DIAGNOSIS — R262 Difficulty in walking, not elsewhere classified: Secondary | ICD-10-CM | POA: Insufficient documentation

## 2016-06-26 DIAGNOSIS — M6281 Muscle weakness (generalized): Secondary | ICD-10-CM | POA: Insufficient documentation

## 2016-06-26 NOTE — Telephone Encounter (Signed)
No show. Called patient who said that she did not know that she had an appointment today. Rescheduled her Wed 06/28/16 appointment to Thursday 06/29/2016 at 1 pm secondary to having to go to the dentist. Reviewed her follow up appointments: Monday 07/03/2016 at 2 pm and Wednesday 07/05/2016 at 2 pm with pt who verbalized understanding.

## 2016-06-29 ENCOUNTER — Ambulatory Visit: Payer: Medicare Other

## 2016-06-29 DIAGNOSIS — M25562 Pain in left knee: Secondary | ICD-10-CM

## 2016-06-29 DIAGNOSIS — M25561 Pain in right knee: Secondary | ICD-10-CM | POA: Diagnosis not present

## 2016-06-29 DIAGNOSIS — M6281 Muscle weakness (generalized): Secondary | ICD-10-CM

## 2016-06-29 DIAGNOSIS — R262 Difficulty in walking, not elsewhere classified: Secondary | ICD-10-CM | POA: Diagnosis present

## 2016-06-29 NOTE — Patient Instructions (Signed)
  Keep chair against wall.    Yellow band around knees.    Stand up but do not let your knees turn in.    You can use your hands.    Repeat 5 times.    Perform 3 sets daily.

## 2016-06-29 NOTE — Therapy (Signed)
[facility] [address] [place], [address] Phone: [phone]   Fax:  [phone]  Physical Therapy Treatment  Patient Details  Name: Andrea Hester MRN: [id number] Date of Birth: 09-Aug-1933 Referring Provider: Governor Specking III, MD  Encounter Date: 06/29/2016      PT End of Session - 06/29/16 1305    Visit Number 5   Number of Visits 21   Date for PT Re-Evaluation 07/27/16   Authorization Type 5   Authorization Time Period of 10   PT Start Time 1306   PT Stop Time 1401   PT Time Calculation (min) 55 min   Equipment Utilized During Treatment Gait belt   Activity Tolerance Patient tolerated treatment well   Behavior During Therapy WFL for tasks assessed/performed      Past Medical History:  Diagnosis Date  . Allergy   . GERD (gastroesophageal reflux disease)   . H/O Clostridium difficile infection   . History of palpitations   . Hyperlipidemia   . Migraines   . White coat hypertension     Past Surgical History:  Procedure Laterality Date  . GALLBLADDER SURGERY    . TONSILLECTOMY      There were no vitals filed for this visit.      Subjective Assessment - 06/29/16 1307    Subjective Pt states being allergic to one of the numbing agents from her dentist yesterday. Also allergic to latex. Knees are about the same. Maybe a little better. Her R calf muslce is a little better with the exercise for it (neural glide).  Thinking about getting a rollator walker to practice walking longer distances because her [facility] is affecting her stride.  R knee is 6/10, and L knee is 5/10 currently. Feels like her R side is the one that bothers her more.  5/10 L knee pain at most and 6/10 R knee pain at most for the past 7 days.    Pertinent History Bilateral knee pain. Pain began about 4 years ago when pt was going up and down the stairs. Used biofreeze which helped for about a year. 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 Pt states that her knees progressively worsened. Pt also has difficulty going up and down stairs to do laundry due to her knee pain.  Has not had any imaging for her knees.  Pt also states she might have pulled a muscle behind her R thigh from a balance exercise (standing and leaning back) that she did.    Patient Stated Goals Be able to get out and get to church. Walk better.    Currently in Pain? Yes   Pain Score 6    Pain Onset More than a month ago            [facility] PT Assessment - 06/29/16 1325      Strength   Right Hip ABduction 4/5   Left Hip ABduction 4/5   Right Knee Flexion 4+/5   Left Knee Flexion 4+/5                             PT Education - 06/29/16 1700    Education provided Yes   Education Details ther-ex, HEP, plan of care   Person(s) Educated Patient   Methods Explanation;Demonstration;Tactile cues;Verbal cues;Handout   Comprehension Verbalized understanding;Returned demonstration  Objectives  Manual therapy  Supine medial glide L and R patella grade 3             Decreased bilateral knee discomfort to 0/10. Improved patellar mobility  STM to R vastus lateralis    There-ex   Directed patient with S/L hip abduction 5x each LE  S/L manually resisted hip abduction, seated manually resisted knee flexion 1x each way for each LE  Reviewed progress/current status with LE strength with pt Gait with SPC, emphasis on increasing hip and knee flexion 32 ft x 5. Improved gait pattern. Pt was recommended to continue with SPC as to continue progress.   Asecnding and descending 4 regular steps 3x with bilateral UE assist. Improved femoral control and proper hand placement on rails for better support with cues.   Sit <> stand from chair with yellow band resisting hip abduction/ER 5x2, emphasis on femoral control  Reviewed plan of care: 2x/week for 4  weeks   Improved exercise technique, movement at target joints, use of target muscles after mod verbal, visual, tactile cues.   Decreased bilateral knee pain with manual therapy to promote mobility to her patellae. Pt also demonstrates improved femoral control and gait pattern with stair negotiation and hand placement compared to initial evaluation and without pain after manual therapy. Pt still demonstrates bilateral hip weakness, knee stiffness, bilateral knee pain, R gastroc symptoms (sciatic) and difficulty performing standing tasks and walking. Pt would benefit from continued skilled physical therapy services to address the aforementioned deficits.          PT Long Term Goals - 06/29/16 1419      PT LONG TERM GOAL #1   Title Patient will have a decrease in R knee pain to 3/10 or less at worst and L knee pain to 2/10 or less at worst to promote ability to stand up from a chair, ascend and descend stairs, and pick up items from the ground.    Baseline Pain at worst: R knee 7/10, L knee 5/10; 6/10 R knee pain at worst, 5/10 L knee pain at worst (06/29/2016)   Time 6   Period Weeks   Status On-going     PT LONG TERM GOAL #2   Title Patient will improve her LEFS score by at least 9 points as a demonstration of improved function.    Baseline 26/80   Time 6   Period Weeks   Status On-going     PT LONG TERM GOAL #3   Title Patient will improve bilateral hip abduction and knee flexion strength by at least 1/2 MMT grade to promote ability to negotiate stairs and pick up items from the floor.    Time 6   Period Weeks   Status On-going     PT LONG TERM GOAL #4   Title Patient will be able to ascend and descend 4 regular steps 3 times with bilateral UE assist with minimal to no complain of knee pain to promote ability to go up and down her stairs at home.    Baseline Increased bilateral knee pain with stair negotiation. Able to ascend and descend 4 regular steps with bilateral UE assist  mod I with improved femoral control and without knee pain (after manual therapy)   Time 6   Period Weeks   Status On-going               Plan - 06/29/16 1302    Clinical Impression Statement Decreased bilateral knee pain with manual  therapy to promote mobility to her patellae. Pt also demonstrates improved femoral control and gait pattern with stair negotiation and hand placement compared to initial evaluation and without pain after manual therapy. Pt still demonstrates bilateral hip weakness, knee stiffness, bilateral knee pain, R gastroc symptoms (sciatic) and difficulty performing standing tasks and walking. Pt would benefit from continued skilled physical therapy services to address the aforementioned deficits.    Rehab Potential Good   Clinical Impairments Affecting Rehab Potential Chronicity of condition, age   PT Frequency 2x / week   PT Duration 4 weeks   PT Treatment/Interventions Therapeutic exercise;Manual techniques;Aquatic Therapy;Ultrasound;Advice worker;Therapeutic activities;Patient/family education;Neuromuscular re-education;Dry needling   PT Next Visit Plan femoral control, mechanics with stair negotiation, movement, hip strengthening, HEP   Consulted and Agree with Plan of Care Patient      Patient will benefit from skilled therapeutic intervention in order to improve the following deficits and impairments:  Pain, Improper body mechanics, Decreased strength  Visit Diagnosis: Pain in right knee - Plan: PT plan of care cert/re-cert  Pain in left knee - Plan: PT plan of care cert/re-cert  Muscle weakness (generalized) - Plan: PT plan of care cert/re-cert  Difficulty in walking, not elsewhere classified - Plan: PT plan of care cert/re-cert     Problem List There are no active problems to display for this patient.   Joneen Boers PT, DPT   06/29/2016, 5:12 PM  [facility] [address], [place], [address] Phone: [phone]   Fax:  [phone]  Name: YASLENE MOIX MRN: [id number] Date of Birth: Jun 15, 1933

## 2016-07-03 ENCOUNTER — Ambulatory Visit: Payer: Medicare Other

## 2016-07-03 DIAGNOSIS — M25561 Pain in right knee: Secondary | ICD-10-CM

## 2016-07-03 DIAGNOSIS — M6281 Muscle weakness (generalized): Secondary | ICD-10-CM

## 2016-07-03 DIAGNOSIS — R262 Difficulty in walking, not elsewhere classified: Secondary | ICD-10-CM

## 2016-07-03 DIAGNOSIS — M25562 Pain in left knee: Secondary | ICD-10-CM

## 2016-07-03 NOTE — Therapy (Signed)
[facility] [address] [place], [address] Phone: [phone]   Fax:  [phone]  Physical Therapy Treatment  Patient Details  Name: Andrea Hester MRN: [id number] Date of Birth: 01/14/1933 Referring Provider: Governor Specking III, MD  Encounter Date: 07/03/2016      PT End of Session - 07/03/16 1356    Visit Number 6   Number of Visits 21   Date for PT Re-Evaluation 07/27/16   Authorization Type 6   Authorization Time Period of 10   PT Start Time [id number]  pt arrived late   PT Stop Time 1445   PT Time Calculation (min) 49 min   Equipment Utilized During Treatment Gait belt   Activity Tolerance Patient tolerated treatment well   Behavior During Therapy [facility] for tasks assessed/performed      Past Medical History:  Diagnosis Date  . Allergy   . GERD (gastroesophageal reflux disease)   . H/O Clostridium difficile infection   . History of palpitations   . Hyperlipidemia   . Migraines   . White coat hypertension     Past Surgical History:  Procedure Laterality Date  . GALLBLADDER SURGERY    . TONSILLECTOMY      There were no vitals filed for this visit.      Subjective Assessment - 07/03/16 1358    Subjective Knees are pretty good today. Had a busy weekend. Kids visited. Knees kind of hurting this morning. Pressing the gas in the car bothers her calf.   Knees felt good after last session. Daughter also noticed a positive difference with going up and down her stairs.  Pt also adds doing an exercise at home before that she saw on the internet which involved pointing her toes up (ankle DF) which caused posterior R knee pulling and R groin discomfort.    Pertinent History Bilateral knee pain. Pain began about 4 years ago when pt was going up and down the stairs. Used biofreeze which helped for about a year. 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 Pt states that her knees progressively worsened. Pt also has difficulty going up and down stairs to do laundry due to her knee pain.  Has not had any imaging for her knees.  Pt also states she might have pulled a muscle behind her R thigh from a balance exercise (standing and leaning back) that she did.    Patient Stated Goals Be able to get out and get to church. Walk better.    Currently in Pain? Yes   Pain Score 6   4-5/10 L knee, 6/10 R knee pain currently.   Pain Onset More than a month ago            [facility] PT Assessment - 07/03/16 1406      AROM   Overall AROM Comments No reproduction of symptoms with lumbar AROM all planes.    Lumbar Flexion WFL, with slight dizziness which subsided in upright position. Pt states she gets dizzy when picking up items from the ground. No R calf pain   Lumbar Extension WFL   Lumbar - Right Side Bend WFL   Lumbar - Left Side Bend WFL   Lumbar - Right Rotation WFL   Lumbar - Left Rotation WFL     Palpation   Palpation comment TTP R pririformis muscle area.  PT Education - 07/03/16 1419    Education provided Yes   Education Details ther-ex   [employer]) Educated Patient   Methods Explanation;Demonstration;Tactile cues;Verbal cues   Comprehension Verbalized understanding;Returned demonstration       Objectives     There-ex   Directed patient with standing trunk flexion (slight dizziness which disappeared with rest), extension, R and L side bending, R and L rotation.   No reproduction of symptoms.    seated R hip ER 10x3. Slight increase in R posterior lateral hip which eases with rest.  No R gastroc muscle symptoms afterwards.   Vitals measured secondary to pt stating being light headed. L arm seated, mechanically taken. 134/52, HR 72 bpm  Pt also adds having a hole in her eardrum  Sit <> stand from regular chair resisting yellow band hip abduction/ER 5x3  Standing  heel raises 10x3 with bialteral UE assist  Ascending and descending 4 regular steps with bilateral UE assist 3x. Min cues for hand placement. Improved femoral control and forward posture.   Side stepping 5 ft to the R and 5 ft to the L with bilateral UE assist 6x each way.   Forward wedding march with on UE assist at treadmill bars 5 ft 10x  Improved exercise technique, movement at target joints, use of target muscles after mod verbal, visual, tactile cues.       Manual therapy  Supine medial glide L and R patella grade 3 Improved patellar mobility. No knee pain bilaterally.   Slight increased dizziness with supine to sit which eased off after rest     No bilateral knee and no R gastroc pain after session. Decreased symptoms with improved patellar mobility and decreased R piriformis muscle use.              PT Long Term Goals - 06/29/16 1419      PT LONG TERM GOAL #1   Title Patient will have a decrease in R knee pain to 3/10 or less at worst and L knee pain to 2/10 or less at worst to promote ability to stand up from a chair, ascend and descend stairs, and pick up items from the ground.    Baseline Pain at worst: R knee 7/10, L knee 5/10; 6/10 R knee pain at worst, 5/10 L knee pain at worst (06/29/2016)   Time 6   Period Weeks   Status On-going     PT LONG TERM GOAL #2   Title Patient will improve her LEFS score by at least 9 points as a demonstration of improved function.    Baseline 26/80   Time 6   Period Weeks   Status On-going     PT LONG TERM GOAL #3   Title Patient will improve bilateral hip abduction and knee flexion strength by at least 1/2 MMT grade to promote ability to negotiate stairs and pick up items from the floor.    Time 6   Period Weeks   Status On-going     PT LONG TERM GOAL #4   Title Patient will be able to ascend and descend 4 regular steps 3 times with bilateral UE assist with minimal to no complain of knee pain to  promote ability to go up and down her stairs at home.    Baseline Increased bilateral knee pain with stair negotiation. Able to ascend and descend 4 regular steps with bilateral UE assist mod I with improved femoral control and without knee pain (after manual therapy)  Time 6   Period Weeks   Status On-going               Plan - 07/03/16 1424    Clinical Impression Statement No bilateral knee and no R gastroc pain after session. Decreased symptoms with improved patellar mobility and decreased R piriformis muscle use.    Rehab Potential Good   Clinical Impairments Affecting Rehab Potential Chronicity of condition, age   PT Frequency 2x / week   PT Duration 4 weeks   PT Treatment/Interventions Therapeutic exercise;Manual techniques;Aquatic Therapy;Ultrasound;Advice worker;Therapeutic activities;Patient/family education;Neuromuscular re-education;Dry needling   PT Next Visit Plan femoral control, mechanics with stair negotiation, movement, hip strengthening, HEP   Consulted and Agree with Plan of Care Patient      Patient will benefit from skilled therapeutic intervention in order to improve the following deficits and impairments:  Pain, Improper body mechanics, Decreased strength  Visit Diagnosis: Pain in right knee  Pain in left knee  Muscle weakness (generalized)  Difficulty in walking, not elsewhere classified     Problem List There are no active problems to display for this patient.   Joneen Boers PT, DPT   07/03/2016, 8:28 PM  [facility] [address], [place], [address] Phone: [phone]   Fax:  [phone]  Name: PASHA AGERS MRN: [id number] Date of Birth: December 15, 1932

## 2016-07-03 NOTE — Patient Instructions (Signed)
 (  Home) Hip: External Rotation - Sitting    You do not have to use a band.    Pull right foot toward body. May hold leg to keep body still.   Repeat __10__ times per set. Do __3__ sets per session daily.  Copyright  VHI. All rights reserved.

## 2016-07-05 ENCOUNTER — Ambulatory Visit: Payer: Medicare Other

## 2016-07-05 DIAGNOSIS — M25561 Pain in right knee: Secondary | ICD-10-CM

## 2016-07-05 DIAGNOSIS — R262 Difficulty in walking, not elsewhere classified: Secondary | ICD-10-CM

## 2016-07-05 DIAGNOSIS — M25562 Pain in left knee: Secondary | ICD-10-CM

## 2016-07-05 DIAGNOSIS — M6281 Muscle weakness (generalized): Secondary | ICD-10-CM

## 2016-07-05 NOTE — Therapy (Signed)
[facility] [address], [place], [address] Phone: [phone]   Fax:  [phone]  Physical Therapy Treatment  Patient Details  Name: ZANDER HAMOR MRN: [id number] Date of Birth: 1933/07/30 Referring Provider: Governor Specking III, MD  Encounter Date: 07/05/2016      PT End of Session - 07/05/16 1358    Visit Number 7   Number of Visits 21   Date for PT Re-Evaluation 07/27/16   Authorization Type 7   Authorization Time Period of 10   PT Start Time [id number]   PT Stop Time 1447   PT Time Calculation (min) 49 min   Activity Tolerance Patient tolerated treatment well   Behavior During Therapy [facility] for tasks assessed/performed      Past Medical History:  Diagnosis Date  . Allergy   . GERD (gastroesophageal reflux disease)   . H/O Clostridium difficile infection   . History of palpitations   . Hyperlipidemia   . Migraines   . White coat hypertension     Past Surgical History:  Procedure Laterality Date  . GALLBLADDER SURGERY    . TONSILLECTOMY      There were no vitals filed for this visit.      Subjective Assessment - 07/05/16 1400    Subjective L knee is not too bad. R Felt pain in the back of her R thigh, knee, and calf. still hurting in that area. 5.5/10 L knee. 6-7/10 R LE pain currently.    Pertinent History Bilateral knee pain. Pain began about 4 years ago when pt was going up and down the stairs. Used biofreeze which helped for about a year. 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 Pt states that her knees progressively worsened. Pt also has difficulty going up and down stairs to do laundry due to her knee pain.  Has not had any imaging for her knees.  Pt also states she might have pulled a muscle behind her R thigh from a balance exercise (standing and leaning back) that she did.    Patient Stated Goals Be able to get out and get  to church. Walk better.    Currently in Pain? Yes   Pain Score 7   6-7/10 R LE pain, 5.5/10 L knee pain   Pain Onset More than a month ago                             Objectives     There-ex    Pt was recommended to hold off on the seated R hip ER exercise. Pt verbalized understanding.   Directed patient with seated R hip IR 10x3. Decreased posterior R thigh discomfort  Seated hip adduction pillow squeeze 10x5 seconds for 3 sets. Decreased R sciatic symptoms  Seated R piriformis stretch 20 seconds x 3 (R knee to L shoulder)  Seated R piriformis stretch (figure 4 position) 30 seconds x3. Decreased R sciatic symptoms  Seated bilateral ankle DF/PF 10x3   No R sciatic pain afterwards. No bilateral knee pain afterwards.     Self manual medial glide to R patella. Needs further instruction.   Side stepping 5 ft to the R and 5 ft to the L with bilateral UE assist 10x each way.   Forward wedding march with on UE assist at treadmill bars 5 ft 10x  SLS with light touch  assist with one UE 5x5 seconds each LE. Able to perform safely.   Pt can perform exercise at home so long as pt has something sturdy to hold on to for safety and pt feels safe with it. Pt demonstrated and verbalized understanding.     Improved exercise technique, movement at target joints, use of target muscles after mod verbal, visual, tactile cues.     Decreased R sciatic symptoms after performing exercises to decrease R piriformis muscle tightness and decrease R piriformis muscle activation as well as with promoting neural mobility.          PT Education - 07/05/16 1410    Education provided Yes   Education Details ther-ex, HEP   Person(s) Educated Patient   Methods Explanation;Demonstration;Tactile cues;Verbal cues;Handout   Comprehension Returned demonstration;Verbalized understanding             PT Long Term Goals - 06/29/16 1419      PT LONG TERM GOAL #1    Title Patient will have a decrease in R knee pain to 3/10 or less at worst and L knee pain to 2/10 or less at worst to promote ability to stand up from a chair, ascend and descend stairs, and pick up items from the ground.    Baseline Pain at worst: R knee 7/10, L knee 5/10; 6/10 R knee pain at worst, 5/10 L knee pain at worst (06/29/2016)   Time 6   Period Weeks   Status On-going     PT LONG TERM GOAL #2   Title Patient will improve her LEFS score by at least 9 points as a demonstration of improved function.    Baseline 26/80   Time 6   Period Weeks   Status On-going     PT LONG TERM GOAL #3   Title Patient will improve bilateral hip abduction and knee flexion strength by at least 1/2 MMT grade to promote ability to negotiate stairs and pick up items from the floor.    Time 6   Period Weeks   Status On-going     PT LONG TERM GOAL #4   Title Patient will be able to ascend and descend 4 regular steps 3 times with bilateral UE assist with minimal to no complain of knee pain to promote ability to go up and down her stairs at home.    Baseline Increased bilateral knee pain with stair negotiation. Able to ascend and descend 4 regular steps with bilateral UE assist mod I with improved femoral control and without knee pain (after manual therapy)   Time 6   Period Weeks   Status On-going               Plan - 07/05/16 1357    Clinical Impression Statement Decreased R sciatic symptoms after performing exercises to decrease R piriformis muscle tightness and decrease R piriformis muscle activation as well as with promoting neural mobility.    Rehab Potential Good   Clinical Impairments Affecting Rehab Potential Chronicity of condition, age   PT Frequency 2x / week   PT Duration 4 weeks   PT Treatment/Interventions Therapeutic exercise;Manual techniques;Aquatic Therapy;Ultrasound;Advice worker;Therapeutic activities;Patient/family education;Neuromuscular  re-education;Dry needling   PT Next Visit Plan femoral control, mechanics with stair negotiation, movement, hip strengthening, HEP   Consulted and Agree with Plan of Care Patient      Patient will benefit from skilled therapeutic intervention in order to improve the following deficits and impairments:  Pain, Improper body mechanics, Decreased strength  Visit Diagnosis: Pain in right knee  Pain in left knee  Muscle weakness (generalized)  Difficulty in walking, not elsewhere classified     Problem List There are no active problems to display for this patient.   Joneen Boers PT, DPT   07/05/2016, 3:51 PM  [facility] [address], [place], [address] Phone: [phone]   Fax:  [phone]  Name: AVELLA RICHMEIER MRN: [id number] Date of Birth: May 01, 1933

## 2016-07-05 NOTE — Patient Instructions (Addendum)
      Sitting on a chair, cross your right leg over your left (right ankle on top you your knee; "figure 4 position").    Gently lean forward until you feel a medium stretch at your right rear end muscle   Hold for 30 seconds.    Repeat 3 times   Perform twice daily.    Sitting on a chair, bend your ankles back and forth    Repeat 10 times   Perform 3 sets.    Repeat twice daily.      Given as part of her HEP: SLS with light touch assist with one UE 5x5 seconds each LE. Able to perform safely.   Pt can perform exercise at home so long as pt has something sturdy to hold on to for safety and pt feels safe with it. Pt demonstrated and verbalized understanding.      Pt was recommended to hold off on the seated R hip ER home exercise. Pt verbalized understanding.

## 2016-07-25 ENCOUNTER — Ambulatory Visit: Payer: Medicare Other | Attending: Orthopedic Surgery

## 2016-07-25 DIAGNOSIS — M6281 Muscle weakness (generalized): Secondary | ICD-10-CM | POA: Insufficient documentation

## 2016-07-25 DIAGNOSIS — G8929 Other chronic pain: Secondary | ICD-10-CM | POA: Diagnosis present

## 2016-07-25 DIAGNOSIS — R262 Difficulty in walking, not elsewhere classified: Secondary | ICD-10-CM | POA: Insufficient documentation

## 2016-07-25 DIAGNOSIS — M25562 Pain in left knee: Secondary | ICD-10-CM | POA: Diagnosis present

## 2016-07-25 DIAGNOSIS — M25561 Pain in right knee: Secondary | ICD-10-CM | POA: Insufficient documentation

## 2016-07-25 NOTE — Patient Instructions (Addendum)
   Strengthening: Resisted Extension    Hold yellow band, one in each hand, arms forward. Pull arm back, elbow straight. Also pinch your shoulder blades together.   Hold for 5 seconds Repeat ___10_ times per set. Do _3___ sets per session. Do _1__ sessions per day.  http://orth.exer.us/832   Copyright  VHI. All rights reserved.     (Home) Extension: Thoracic With Lumbar Lock - Sitting    Sit with back against chair, knees bent, hands folded across (not shown). Extend trunk over chair back. Hold position for __5__ seconds. Repeat ___10  times per set. Do _3___ sets per session daily.   Copyright  VHI. All rights reserved.     ABDUCTION: Standing (Active)   Hold onto something sturdy for safety. Stand, feet flat. Lift right leg out to side. Use _0__ lbs. Complete __10_ repetitions. Perform __3_ sessions per day.   Repeat for your other side.

## 2016-07-25 NOTE — Therapy (Signed)
[place] [address] [place], [address] Phone: [phone]   Fax:  [phone]  Physical Therapy Treatment  Patient Details  Name: Andrea Hester MRN: [id number] Date of Birth: January 26, 1933 Referring Provider: Governor Specking III, MD  Encounter Date: 07/25/2016      PT End of Session - 07/25/16 1018    Visit Number 8   Number of Visits 21   Date for PT Re-Evaluation 07/27/16   Authorization Type 8   Authorization Time Period of 10   PT Start Time 1018   PT Stop Time 1106   PT Time Calculation (min) 48 min   Activity Tolerance Patient tolerated treatment well   Behavior During Therapy [facility] for tasks assessed/performed      Past Medical History:  Diagnosis Date  . Allergy   . GERD (gastroesophageal reflux disease)   . H/O Clostridium difficile infection   . History of palpitations   . Hyperlipidemia   . Migraines   . White coat hypertension     Past Surgical History:  Procedure Laterality Date  . GALLBLADDER SURGERY    . TONSILLECTOMY      There were no vitals filed for this visit.      Subjective Assessment - 07/25/16 1021    Subjective Pt states stopping the hydrochlorothiazide due to aches and pains in her bones which helped. Pt states her MD does not know about the stop of the medication. The medication was supposed to fix the bump on her leg which did not help.  Knees are about the same. Some days are better. Ices her knees when she gets up. Knees usually feels better around 2:30 in the afternoon. Still has calf pain in her R.   The sciatic nerve seems to be better. Still has calf pain which affects her driving to church.  Knees feel stiff currently, not really pain. R calf pain is 4/10 currently.   5/10 R knee pain at worst, and 4/10 L knee pain at worst for the past 7 days.  Stopping the diuretic medication also helped.    Pertinent History Bilateral knee pain. Pain began about 4  years ago when pt was going up and down the stairs. Used biofreeze which helped for about a year. 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 Pt states that her knees progressively worsened. Pt also has difficulty going up and down stairs to do laundry due to her knee pain.  Has not had any imaging for her knees.  Pt also states she might have pulled a muscle behind her R thigh from a balance exercise (standing and leaning back) that she did.    Patient Stated Goals Be able to get out and get to church. Walk better.    Currently in Pain? Yes   Pain Score 4   R calf pain   Pain Onset More than a month ago            [facility] PT Assessment - 07/25/16 2041      Observation/Other Assessments   Lower Extremity Functional Scale  29/80                             PT Education - 07/25/16 1027    Education provided Yes   Education Details ther-ex, HEP   Person(s) Educated Patient   Methods Explanation;Demonstration;Tactile cues;Verbal  cues;Handout   Comprehension Verbalized understanding;Returned demonstration      Objectives   There-ex   Vitals obtained secondary to stopping her diuretic medication  155/69, HR 81 bpm    Directed patient with seated R hip extension isometrics 10x3 with 5 second holds   Increased R gastroc pain  Seated bilateral ankle DF/PF 10x3   Seated bilateral hip adduction resisting small physioball 10x3 with 5 second holds  Forward step ups onto first regular step with R LE, bilateral UE assist 4x. Increased R gastroc pain  Forward step up onto 3 inch step with R LE 10x2, with PT assist to decrease R lumbar side bend. Decreased R gastroc pain  Standing bilateral shoulder extension resisting yellow band 10x3 with 5 seconds   Seated thoracic extension lumbar locked position on chair 10x5 seconds for 2 sets  standing hip abduction with bilateral UE assist 10x each LE   Improved  exercise technique, movement at target joints, use of target muscles after mod verbal, visual, tactile cues.     Increased R gastroc pain with L lumbar side bend compensation with forward step ups. Decreased with PT assist to decrease compensation and promote more neutral lumbar spine position. Worked on trunk stability and control as well as thoracic extension to help with R gastroc symptoms. Pt demonstrates overall decreased bilateral knee pain since initial evaluation.           PT Long Term Goals - 06/29/16 1419      PT LONG TERM GOAL #1   Title Patient will have a decrease in R knee pain to 3/10 or less at worst and L knee pain to 2/10 or less at worst to promote ability to stand up from a chair, ascend and descend stairs, and pick up items from the ground.    Baseline Pain at worst: R knee 7/10, L knee 5/10; 6/10 R knee pain at worst, 5/10 L knee pain at worst (06/29/2016)   Time 6   Period Weeks   Status On-going     PT LONG TERM GOAL #2   Title Patient will improve her LEFS score by at least 9 points as a demonstration of improved function.    Baseline 26/80   Time 6   Period Weeks   Status On-going     PT LONG TERM GOAL #3   Title Patient will improve bilateral hip abduction and knee flexion strength by at least 1/2 MMT grade to promote ability to negotiate stairs and pick up items from the floor.    Time 6   Period Weeks   Status On-going     PT LONG TERM GOAL #4   Title Patient will be able to ascend and descend 4 regular steps 3 times with bilateral UE assist with minimal to no complain of knee pain to promote ability to go up and down her stairs at home.    Baseline Increased bilateral knee pain with stair negotiation. Able to ascend and descend 4 regular steps with bilateral UE assist mod I with improved femoral control and without knee pain (after manual therapy)   Time 6   Period Weeks   Status On-going               Plan - 07/25/16 1027    Clinical  Impression Statement Increased R gastroc pain with L lumbar side bend compensation with forward step ups. Decreased with PT assist to decrease compensation and promote more neutral lumbar spine position. Worked on trunk  stability and control as well as thoracic extension to help with R gastroc symptoms. Pt demonstrates overall decreased bilateral knee pain since initial evaluation.    Rehab Potential Good   Clinical Impairments Affecting Rehab Potential Chronicity of condition, age   PT Frequency 2x / week   PT Duration 4 weeks   PT Treatment/Interventions Therapeutic exercise;Manual techniques;Aquatic Therapy;Ultrasound;Advice worker;Therapeutic activities;Patient/family education;Neuromuscular re-education;Dry needling   PT Next Visit Plan femoral control, mechanics with stair negotiation, movement, hip strengthening, HEP   Consulted and Agree with Plan of Care Patient      Patient will benefit from skilled therapeutic intervention in order to improve the following deficits and impairments:  Pain, Improper body mechanics, Decreased strength  Visit Diagnosis: Chronic pain of right knee  Chronic pain of left knee  Muscle weakness (generalized)  Difficulty in walking, not elsewhere classified     Problem List There are no active problems to display for this patient.  Joneen Boers PT, DPT   07/25/2016, 8:51 PM  [place] [facility] [address], [place], [address] Phone: [phone]   Fax:  [phone]  Name: Andrea Hester MRN: [id number] Date of Birth: 29-Sep-1933

## 2016-07-27 ENCOUNTER — Ambulatory Visit: Payer: Medicare Other

## 2016-07-27 DIAGNOSIS — M6281 Muscle weakness (generalized): Secondary | ICD-10-CM

## 2016-07-27 DIAGNOSIS — M25561 Pain in right knee: Principal | ICD-10-CM

## 2016-07-27 DIAGNOSIS — M25562 Pain in left knee: Secondary | ICD-10-CM

## 2016-07-27 DIAGNOSIS — G8929 Other chronic pain: Secondary | ICD-10-CM

## 2016-07-27 DIAGNOSIS — R262 Difficulty in walking, not elsewhere classified: Secondary | ICD-10-CM

## 2016-07-27 NOTE — Patient Instructions (Addendum)
   PELVIC TILT: Posterior    Tighten abdominals, flatten low back.    _10__ reps per set, __3_ sets per day   Copyright  VHI. All rights reserved.     Reviewed and given SLS on R LE with L tip toe assist, holding onto something sturdy for support and safety with emphasis on pelvic control as part of her HEP 10x3 with 5 second holds daily. Handout provided.

## 2016-07-27 NOTE — Therapy (Signed)
[place] [facility] [address], [place], [address] Phone: [phone]   Fax:  [phone]  Physical Therapy Treatment And Progress Report  Patient Details  Name: Andrea Hester MRN: [id number] Date of Birth: 1933-09-28 Referring Provider: Governor Specking III, MD  Encounter Date: 07/27/2016      PT End of Session - 07/27/16 1602    Visit Number 9   Number of Visits 31   Date for PT Re-Evaluation 08/31/16   Authorization Type 1   Authorization Time Period of 10   PT Start Time 1602   PT Stop Time [id number]   PT Time Calculation (min) 45 min   Activity Tolerance Patient tolerated treatment well   Behavior During Therapy [facility] for tasks assessed/performed      Past Medical History:  Diagnosis Date  . Allergy   . GERD (gastroesophageal reflux disease)   . H/O Clostridium difficile infection   . History of palpitations   . Hyperlipidemia   . Migraines   . White coat hypertension     Past Surgical History:  Procedure Laterality Date  . GALLBLADDER SURGERY    . TONSILLECTOMY      There were no vitals filed for this visit.      Subjective Assessment - 07/27/16 1604    Subjective Called her doctor and went back on her hydrochlorothiazide medication until her appointment with him next Tuesday. Knees have been feeling good.  Tired today from doing groceries. R calf 7/10 currently. Pt thinks it's from the driving and carrying groceries up her home steps.  6/10 R knee currently 5/10 L knee pain currently. Knees feel better with movement. Since coming to therapy, knees feel better.    Pertinent History Bilateral knee pain. Pain began about 4 years ago when pt was going up and down the stairs. Used biofreeze which helped for about a year. 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 Pt states that her knees progressively worsened. Pt also has difficulty going up  and down stairs to do laundry due to her knee pain.  Has not had any imaging for her knees.  Pt also states she might have pulled a muscle behind her R thigh from a balance exercise (standing and leaning back) that she did.    Patient Stated Goals Be able to get out and get to church. Walk better.    Currently in Pain? Yes   Pain Score 7   R gastroc, 6/10 R knee, 5/10 L knee   Pain Onset More than a month ago            [facility] PT Assessment - 07/27/16 1610      Observation/Other Assessments   Lower Extremity Functional Scale  29/80  Measured on 07/25/2016     Strength   Right Hip ABduction 4+/5   Left Hip ABduction 4+/5   Right Knee Flexion 4+/5   Left Knee Flexion 5/5                             PT Education - 07/27/16 1634    Education provided Yes   Education Details ther-ex, HEP, progress/current status with LE strength   Person(s) Educated Patient   Methods Explanation;Demonstration;Tactile cues;Verbal cues;Handout   Comprehension Returned demonstration;Verbalized understanding        Objectives  Decreased R gastroc pain to 0/10 with  sitting.   There-ex  Directed patient with seated manually resisted knee flexion, S/L hip abduction 1-2x each way for each LE  Reviewed progress with strength with pt.   Supine lower trunk rotation 8x5 seconds each side. R low back back discomfort with L lower trunk rotation.  Then R lower trunk rotation 10x5 seconds. L low back discomfort toward the end.   Low back discomfort eases with rest.   Supine posterior pelvic tilt 10x3  Then 5x5 seconds for 2 sets  Supine R single knee to chest 2x5 with 5 second holds  Seated thoracic extension using chair, lumbar locked position (feet propped on 3 inch step) 10x5 second holds for 2 sets. Decreased R gastroc pain.   Forward step up onto 3 inch step with L UE assist 3x. R lumbar side bend with L pelvic drop compensation.   SLS on R LE with L tip toe assist  10x5 seconds x 2 sets with bilateral UE assist, emphasis on pelvic control   Reviewed and given as part of her HEP 10x3 with 5 second holds daily, holding onto something sturdy for support. Handout provided.    Improved exercise technique, movement at target joints, use of target muscles after mod verbal, visual, tactile cues.      Worked on lumbar mobility and exercises to promote increased intervertebral foraminal openning  to help decrease R gastroc pain. R gastroc pain increases with R side bend compensation while stepping up onto a step.  Pt demonstrates improved R and L hip abductor strength and L knee flexion strength since last measured on 06/29/2016. Overall improved R knee flexion strength since initial evaluation. Pt also demonstrates improved step length with less shuffling with gait and is better able to negotiate stairs with improved femoral control and less knee discomfort. Knee pain improves with movement. Patient still demonstrates bilateral knee pain and R gastroc/sciatic pain which makes performing functional tasks such as pressing on the gas pedal to drive, stair negotiation, and going to church challenging and would benefit from continued skilled physical therapy services to address the aforementioned deficits.         PT Long Term Goals - 07/27/16 1919      PT LONG TERM GOAL #1   Title Patient will have a decrease in R knee pain to 3/10 or less at worst and L knee pain to 2/10 or less at worst to promote ability to stand up from a chair, ascend and descend stairs, and pick up items from the ground.    Baseline Pain at worst: R knee 7/10, L knee 5/10; 6/10 R knee pain at worst, 5/10 L knee pain at worst (06/29/2016 and 07/27/2016);    Time 5   Period Weeks   Status On-going     PT LONG TERM GOAL #2   Title Patient will improve her LEFS score by at least 9 points as a demonstration of improved function.    Baseline 26/80; 29/80 (07/25/2016)   Time 5   Period Weeks    Status On-going     PT LONG TERM GOAL #3   Title Patient will improve bilateral hip abduction and knee flexion strength by at least 1/2 MMT grade to promote ability to negotiate stairs and pick up items from the floor.    Time 6   Period Weeks   Status Achieved     PT LONG TERM GOAL #4   Title Patient will be able to ascend and descend 4 regular steps 3  times with bilateral UE assist with minimal to no complain of knee pain to promote ability to go up and down her stairs at home.    Baseline Increased bilateral knee pain with stair negotiation. Able to ascend and descend 4 regular steps with bilateral UE assist mod I with improved femoral control and without knee pain (after manual therapy)   Time 5   Period Weeks   Status On-going               Plan - 2016-08-02 1918    Clinical Impression Statement Pt demonstrates improved R and L hip abductor strength and L knee flexion strength since last measured on 06/29/2016. Overall improved R knee flexion strength since initial evaluation. Pt also demonstrates improved step length with less shuffling with gait and is better able to negotiate stairs with improved femoral control and less knee discomfort. Knee pain improves with movement. Patient still demonstrates bilateral knee pain and R gastroc/sciatic pain which makes performing functional tasks such as pressing on the gas pedal to drive, stair negotiation, and going to church challenging and would benefit from continued skilled physical therapy services to address the aforementioned deficits.    Rehab Potential Good   Clinical Impairments Affecting Rehab Potential Chronicity of condition, age   PT Frequency 2x / week   PT Duration Other (comment)  5 weeks   PT Treatment/Interventions Therapeutic exercise;Manual techniques;Aquatic Therapy;Ultrasound;Advice worker;Therapeutic activities;Patient/family education;Neuromuscular re-education;Dry needling   PT Next Visit Plan  femoral control, mechanics with stair negotiation, movement, hip strengthening, HEP   Consulted and Agree with Plan of Care Patient      Patient will benefit from skilled therapeutic intervention in order to improve the following deficits and impairments:  Pain, Improper body mechanics, Decreased strength  Visit Diagnosis: Chronic pain of right knee - Plan: PT plan of care cert/re-cert  Chronic pain of left knee - Plan: PT plan of care cert/re-cert  Muscle weakness (generalized) - Plan: PT plan of care cert/re-cert  Difficulty in walking, not elsewhere classified - Plan: PT plan of care cert/re-cert       G-Codes - 08/02/2016 1940    Functional Assessment Tool Used LEFS, clinical presentation, patient interview   Functional Limitation Mobility: Walking and moving around   Mobility: Walking and Moving Around Current Status [phone]) At least 60 percent but less than 80 percent impaired, limited or restricted   Mobility: Walking and Moving Around Goal Status [phone]) At least 20 percent but less than 40 percent impaired, limited or restricted      Problem List There are no active problems to display for this patient.   Thank you for your referral.  Joneen Boers PT, DPT    Aug 02, 2016, 7:45 PM  [place] [facility] [address], [place], [address] Phone: [phone]   Fax:  [phone]  Name: Andrea Hester MRN: [id number] Date of Birth: 1932-10-12

## 2016-07-31 ENCOUNTER — Ambulatory Visit: Payer: Medicare Other

## 2016-07-31 VITALS — BP 147/68 | HR 89

## 2016-07-31 DIAGNOSIS — M25561 Pain in right knee: Secondary | ICD-10-CM | POA: Diagnosis not present

## 2016-07-31 DIAGNOSIS — R262 Difficulty in walking, not elsewhere classified: Secondary | ICD-10-CM

## 2016-07-31 DIAGNOSIS — M25562 Pain in left knee: Secondary | ICD-10-CM

## 2016-07-31 DIAGNOSIS — G8929 Other chronic pain: Secondary | ICD-10-CM

## 2016-07-31 DIAGNOSIS — M6281 Muscle weakness (generalized): Secondary | ICD-10-CM

## 2016-07-31 NOTE — Therapy (Signed)
[place] [facility] [address] [place], [address] Phone: [phone]   Fax:  [phone]  Physical Therapy Treatment  Patient Details  Name: Andrea Hester MRN: [id number] Date of Birth: 24-Nov-1932 Referring Provider: Governor Specking III, MD  Encounter Date: 07/31/2016      PT End of Session - 07/31/16 1433    Visit Number 10   Number of Visits 31   Date for PT Re-Evaluation 08/31/16   Authorization Type 2   Authorization Time Period of 10   PT Start Time 1433   PT Stop Time 1517   PT Time Calculation (min) 44 min   Activity Tolerance Patient tolerated treatment well   Behavior During Therapy [facility] for tasks assessed/performed      Past Medical History:  Diagnosis Date  . Allergy   . GERD (gastroesophageal reflux disease)   . H/O Clostridium difficile infection   . History of palpitations   . Hyperlipidemia   . Migraines   . White coat hypertension     Past Surgical History:  Procedure Laterality Date  . GALLBLADDER SURGERY    . TONSILLECTOMY      Vitals:   07/31/16 1440  BP: (!) 147/68  Pulse: 89        Subjective Assessment - 07/31/16 1435    Subjective Pt stated feeling more stiff after getting back on her hydrochlorothiazide. 7/10 R knee, 6/10 L knee currently.  Feels dizzy when taking her medication. Also affects her digestive system.    Pertinent History Bilateral knee pain. Pain began about 4 years ago when pt was going up and down the stairs. Used biofreeze which helped for about a year. 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 Pt states that her knees progressively worsened. Pt also has difficulty going up and down stairs to do laundry due to her knee pain.  Has not had any imaging for her knees.  Pt also states she might have pulled a muscle behind her R thigh from a balance exercise (standing and leaning back) that she did.     Patient Stated Goals Be able to get out and get to church. Walk better.    Currently in Pain? Yes   Pain Score 7    Pain Onset More than a month ago                                 PT Education - 07/31/16 1526    Education provided Yes   Education Details ther-ex   [employer]) Educated Patient   Methods Explanation;Demonstration;Tactile cues;Verbal cues   Comprehension Returned demonstration;Verbalized understanding       Objectives  Manual therapy  Medial glide patella grade 3 to R and L. L patella more stiff compared to R. Improved mobility after manual therapy.   STM L vastus lateralis to promote medial glide to L patella.     There-ex   Vitals obtained secondary to pt stating feeling dizziness at times prior to session.   Supine posterior pelvic tilt 10x,              Then 10x5 seconds for 2 sets   Then R lower trunk rotation 10x5 seconds. No complain of low back discomfort today.   Forward step up onto regular step with bilateral UE assist, emphasis on level pelvis, mod assist to  do so 5x3 with R LE. No R gastroc pain  SLS on R LE with L tip toe assist 10x5 seconds x 2 sets with bilateral UE assist, emphasis on pelvic control   Standing hip abduction with bilateral UE assist 10x3 each LE    Improved exercise technique, movement at target joints, use of target muscles after mod verbal, visual, tactile cues.     No R gastroc pain with PT assist to maintain neutral pelvis during R forward step up onto regular step. Improved pelvic control after cues and use of glute med muscles with standing exercises. No bilateral knee or R gastroc pain after session and manual therapy to promote patellar mobilization and exercises to promote lumbopelvic control in standing.          PT Long Term Goals - 07/27/16 1919      PT LONG TERM GOAL #1   Title Patient will have a decrease in R knee pain to 3/10 or less at worst and L knee pain to 2/10  or less at worst to promote ability to stand up from a chair, ascend and descend stairs, and pick up items from the ground.    Baseline Pain at worst: R knee 7/10, L knee 5/10; 6/10 R knee pain at worst, 5/10 L knee pain at worst (06/29/2016 and 07/27/2016);    Time 5   Period Weeks   Status On-going     PT LONG TERM GOAL #2   Title Patient will improve her LEFS score by at least 9 points as a demonstration of improved function.    Baseline 26/80; 29/80 (07/25/2016)   Time 5   Period Weeks   Status On-going     PT LONG TERM GOAL #3   Title Patient will improve bilateral hip abduction and knee flexion strength by at least 1/2 MMT grade to promote ability to negotiate stairs and pick up items from the floor.    Time 6   Period Weeks   Status Achieved     PT LONG TERM GOAL #4   Title Patient will be able to ascend and descend 4 regular steps 3 times with bilateral UE assist with minimal to no complain of knee pain to promote ability to go up and down her stairs at home.    Baseline Increased bilateral knee pain with stair negotiation. Able to ascend and descend 4 regular steps with bilateral UE assist mod I with improved femoral control and without knee pain (after manual therapy)   Time 5   Period Weeks   Status On-going               Plan - 07/31/16 1526    Clinical Impression Statement No R gastroc pain with PT assist to maintain neutral pelvis during R forward step up onto regular step. Improved pelvic control after cues and use of glute med muscles with standing exercises. No bilateral knee or R gastroc pain after session and manual therapy to promote patellar mobilization and exercises to promote lumbopelvic control in standing.    Rehab Potential Good   Clinical Impairments Affecting Rehab Potential Chronicity of condition, age   PT Frequency 2x / week   PT Duration Other (comment)  5 weeks   PT Treatment/Interventions Therapeutic exercise;Manual techniques;Aquatic  Therapy;Ultrasound;Advice worker;Therapeutic activities;Patient/family education;Neuromuscular re-education;Dry needling   PT Next Visit Plan femoral control, mechanics with stair negotiation, movement, hip strengthening, HEP   Consulted and Agree with Plan of Care Patient  Patient will benefit from skilled therapeutic intervention in order to improve the following deficits and impairments:  Pain, Improper body mechanics, Decreased strength  Visit Diagnosis: Chronic pain of right knee  Chronic pain of left knee  Muscle weakness (generalized)  Difficulty in walking, not elsewhere classified     Problem List There are no active problems to display for this patient.   Joneen Boers PT, DPT   07/31/2016, 3:38 PM  [place] [facility] [address], [place], [address] Phone: [phone]   Fax:  [phone]  Name: WILLOWDEAN ZOCCHI MRN: [id number] Date of Birth: Apr 10, 1933

## 2016-08-02 ENCOUNTER — Ambulatory Visit: Payer: Medicare Other

## 2016-08-02 DIAGNOSIS — G8929 Other chronic pain: Secondary | ICD-10-CM

## 2016-08-02 DIAGNOSIS — M25562 Pain in left knee: Secondary | ICD-10-CM

## 2016-08-02 DIAGNOSIS — M25561 Pain in right knee: Secondary | ICD-10-CM | POA: Diagnosis not present

## 2016-08-02 DIAGNOSIS — I10 Essential (primary) hypertension: Secondary | ICD-10-CM | POA: Insufficient documentation

## 2016-08-02 DIAGNOSIS — M6281 Muscle weakness (generalized): Secondary | ICD-10-CM

## 2016-08-02 DIAGNOSIS — R262 Difficulty in walking, not elsewhere classified: Secondary | ICD-10-CM

## 2016-08-02 NOTE — Therapy (Signed)
[facility] [address], [place], [address] Phone: [phone]   Fax:  [phone]  Physical Therapy Treatment  Patient Details  Name: Andrea Hester MRN: [id number] Date of Birth: 29-Apr-1933 Referring Provider: Governor Specking III, MD  Encounter Date: 08/02/2016      PT End of Session - 08/02/16 1042    Visit Number 11   Number of Visits 31   Date for PT Re-Evaluation 08/31/16   Authorization Type 3   Authorization Time Period of 10   PT Start Time 1042  pt arrived late   PT Stop Time 1124   PT Time Calculation (min) 42 min   Activity Tolerance Patient tolerated treatment well   Behavior During Therapy [facility] for tasks assessed/performed      Past Medical History:  Diagnosis Date  . Allergy   . GERD (gastroesophageal reflux disease)   . H/O Clostridium difficile infection   . History of palpitations   . Hyperlipidemia   . Migraines   . White coat hypertension     Past Surgical History:  Procedure Laterality Date  . GALLBLADDER SURGERY    . TONSILLECTOMY      There were no vitals filed for this visit.      Subjective Assessment - 08/02/16 1043    Subjective L knee feels good. R knee does not. R butt feels bad. Feels muscle soreness after therapy. The calf feels better today, but feels R rear end and lateral thigh pain.  6/10 R gastroc, R knee, 5/10 L knee currently.    Pertinent History Bilateral knee pain. Pain began about 4 years ago when pt was going up and down the stairs. Used biofreeze which helped for about a year. 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 Pt states that her knees progressively worsened. Pt also has difficulty going up and down stairs to do laundry due to her knee pain.  Has not had any imaging for her knees.  Pt also states she might have pulled a muscle behind her R thigh from a balance exercise (standing  and leaning back) that she did.    Patient Stated Goals Be able to get out and get to church. Walk better.    Currently in Pain? Yes   Pain Score 6    Pain Onset More than a month ago                                 PT Education - 08/02/16 1237    Education provided Yes   Education Details ther-ex   [employer]) Educated Patient   Methods Explanation;Demonstration;Tactile cues;Verbal cues   Comprehension Returned demonstration;Verbalized understanding        Objectives   There-ex  Directed patient with standing posterior pelvic tilt 10x5 seconds  Increased R sciatic symptoms   Standing bilateral shoulder extension resisting yellow band 10x3 with 5 second holds  Decreased bilateral lumbar praspinal tension. Decreased R sciatic nerve symptoms and R knee stiffness. Encouraged patient to perform the HEP more often. Pt verbalized understanding.   Standing L shoulder adduction resisting yellow band to decrease R lateral shift posture 10x3 with 5 seconds  Seated gentle thoracic extension over chair 10x5 seconds.   Decreased R knee stiffness.   Standing heel raises 10x3 with bilateral UE assist. Slight R gastroc tightness.  Seated ankle  DF 10x3. Performed in sitting secondary to pt mentioning increased sciatic symptoms when pt tried it in standing herself prior to initial evaluation after seeing it in the internet.   Decreased R gastroc tightness.   Improved exercise technique, movement at target joints, use of target muscles after mod verbal, visual, tactile cues.     Decreased R knee stiffness and better able to bend her R knee (per pt) after standing shoulder exercises and seated thoracic extension exercise to decrease R sciatic symptoms. Possible connection between R knee stiffness to sciatic symptoms.  No R knee or calf pain after session.             PT Long Term Goals - 07/27/16 1919      PT LONG TERM GOAL #1   Title Patient will have a  decrease in R knee pain to 3/10 or less at worst and L knee pain to 2/10 or less at worst to promote ability to stand up from a chair, ascend and descend stairs, and pick up items from the ground.    Baseline Pain at worst: R knee 7/10, L knee 5/10; 6/10 R knee pain at worst, 5/10 L knee pain at worst (06/29/2016 and 07/27/2016);    Time 5   Period Weeks   Status On-going     PT LONG TERM GOAL #2   Title Patient will improve her LEFS score by at least 9 points as a demonstration of improved function.    Baseline 26/80; 29/80 (07/25/2016)   Time 5   Period Weeks   Status On-going     PT LONG TERM GOAL #3   Title Patient will improve bilateral hip abduction and knee flexion strength by at least 1/2 MMT grade to promote ability to negotiate stairs and pick up items from the floor.    Time 6   Period Weeks   Status Achieved     PT LONG TERM GOAL #4   Title Patient will be able to ascend and descend 4 regular steps 3 times with bilateral UE assist with minimal to no complain of knee pain to promote ability to go up and down her stairs at home.    Baseline Increased bilateral knee pain with stair negotiation. Able to ascend and descend 4 regular steps with bilateral UE assist mod I with improved femoral control and without knee pain (after manual therapy)   Time 5   Period Weeks   Status On-going               Plan - 08/02/16 1238    Clinical Impression Statement Decreased R knee stiffness and better able to bend her R knee (per pt) after standing shoulder exercises and seated thoracic extension exercise to decrease R sciatic symptoms. Possible connection between R knee stiffness to sciatic symptoms.  No R knee or calf pain after session.     Rehab Potential Good   Clinical Impairments Affecting Rehab Potential Chronicity of condition, age   PT Frequency 2x / week   PT Duration Other (comment)  5 weeks   PT Treatment/Interventions Therapeutic exercise;Manual techniques;Aquatic  Therapy;Ultrasound;Advice worker;Therapeutic activities;Patient/family education;Neuromuscular re-education;Dry needling   PT Next Visit Plan femoral control, mechanics with stair negotiation, movement, hip strengthening, HEP   Consulted and Agree with Plan of Care Patient      Patient will benefit from skilled therapeutic intervention in order to improve the following deficits and impairments:  Pain, Improper body mechanics, Decreased strength  Visit Diagnosis: Chronic pain  of right knee  Chronic pain of left knee  Muscle weakness (generalized)  Difficulty in walking, not elsewhere classified     Problem List There are no active problems to display for this patient.  Joneen Boers PT, DPT   08/02/2016, 12:46 PM  [facility] [address] [place], [address] Phone: [phone]   Fax:  [phone]  Name: Andrea Hester MRN: [id number] Date of Birth: 03-Sep-1933

## 2016-08-07 ENCOUNTER — Ambulatory Visit: Payer: Medicare Other

## 2016-08-07 DIAGNOSIS — G8929 Other chronic pain: Secondary | ICD-10-CM

## 2016-08-07 DIAGNOSIS — M6281 Muscle weakness (generalized): Secondary | ICD-10-CM

## 2016-08-07 DIAGNOSIS — M25561 Pain in right knee: Principal | ICD-10-CM

## 2016-08-07 DIAGNOSIS — R262 Difficulty in walking, not elsewhere classified: Secondary | ICD-10-CM

## 2016-08-07 DIAGNOSIS — M25562 Pain in left knee: Secondary | ICD-10-CM

## 2016-08-07 NOTE — Therapy (Signed)
[facility] [address] [place], [address] Phone: [phone]   Fax:  [phone]  Physical Therapy Treatment  Patient Details  Name: Andrea Hester MRN: [id number] Date of Birth: 02-Aug-1933 Referring Provider: Governor Specking III, MD  Encounter Date: 08/07/2016      PT End of Session - 08/07/16 1352    Visit Number 12   Number of Visits 31   Date for PT Re-Evaluation 08/31/16   Authorization Type 4   Authorization Time Period of 10   PT Start Time 1352  pt arrived late   PT Stop Time 1433   PT Time Calculation (min) 41 min   Activity Tolerance Patient tolerated treatment well   Behavior During Therapy [facility] for tasks assessed/performed      Past Medical History:  Diagnosis Date  . Allergy   . GERD (gastroesophageal reflux disease)   . H/O Clostridium difficile infection   . History of palpitations   . Hyperlipidemia   . Migraines   . White coat hypertension     Past Surgical History:  Procedure Laterality Date  . GALLBLADDER SURGERY    . TONSILLECTOMY      There were no vitals filed for this visit.      Subjective Assessment - 08/07/16 1354    Subjective L knee feels good. Feels stiff R knee. R sciatic nerve was bothering her last session. The heel raises bothered her calf muscle.  Sciatic nerve bothered her this past weekend. Tried to do the shoulder extension exercises at home which caused neck discomfort. The band at home might be too high.  Pt also states that her MD decreased her hydrochlorothiazide medication to once every other day.    Pertinent History Bilateral knee pain. Pain began about 4 years ago when pt was going up and down the stairs. Used biofreeze which helped for about a year. 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 Pt states that her knees progressively worsened. Pt also has difficulty going up and down  stairs to do laundry due to her knee pain.  Has not had any imaging for her knees.  Pt also states she might have pulled a muscle behind her R thigh from a balance exercise (standing and leaning back) that she did.    Patient Stated Goals Be able to get out and get to church. Walk better.    Currently in Pain? Yes   Pain Score --  no pain level provided   Pain Onset More than a month ago                                 PT Education - 08/07/16 2018    Education provided Yes   Education Details ther-ex   [employer]) Educated Patient   Methods Explanation;Demonstration;Tactile cues;Verbal cues   Comprehension Returned demonstration;Verbalized understanding        Objectives   There-ex  Directed patient with standing bilateral shoulder extension resisting yellow band 10x5 seconds for 2 sets.   Recommended for pt to attach band at door knob at home, so the band is at a lower height and cues for decreasing shoulder shrug. Pt demonstrated and verbalized understanding.   Standing L shoulder adduction resisting yellow band to decrease R lateral shift posture 10x2 with 5 seconds  Seated R knee flexion AAROM using ball  10x3   Seated R hip extension isometrics 10x5 seconds  Seated R hip extension resisting red band 10x2. Good R glute muscle use felt.   Recommended for pt to rest her feet on the pews at church to decrease pressure to low back. Pt verbalized understanding.     Improved exercise technique, movement at target joints, use of target muscles after mod verbal, visual, tactile cues.     Manual therapy  Medial glide to R and L patellae STM R VMO  Decreased symptoms.    Decreased R gastroc pain with improved ability to bend her R knee after session.         PT Long Term Goals - 07/27/16 1919      PT LONG TERM GOAL #1   Title Patient will have a decrease in R knee pain to 3/10 or less at worst and L knee pain to 2/10 or less at worst to  promote ability to stand up from a chair, ascend and descend stairs, and pick up items from the ground.    Baseline Pain at worst: R knee 7/10, L knee 5/10; 6/10 R knee pain at worst, 5/10 L knee pain at worst (06/29/2016 and 07/27/2016);    Time 5   Period Weeks   Status On-going     PT LONG TERM GOAL #2   Title Patient will improve her LEFS score by at least 9 points as a demonstration of improved function.    Baseline 26/80; 29/80 (07/25/2016)   Time 5   Period Weeks   Status On-going     PT LONG TERM GOAL #3   Title Patient will improve bilateral hip abduction and knee flexion strength by at least 1/2 MMT grade to promote ability to negotiate stairs and pick up items from the floor.    Time 6   Period Weeks   Status Achieved     PT LONG TERM GOAL #4   Title Patient will be able to ascend and descend 4 regular steps 3 times with bilateral UE assist with minimal to no complain of knee pain to promote ability to go up and down her stairs at home.    Baseline Increased bilateral knee pain with stair negotiation. Able to ascend and descend 4 regular steps with bilateral UE assist mod I with improved femoral control and without knee pain (after manual therapy)   Time 5   Period Weeks   Status On-going               Plan - 08/07/16 2018    Clinical Impression Statement Decreased R gastroc pain with improved ability to bend her R knee after session.    Rehab Potential Good   Clinical Impairments Affecting Rehab Potential Chronicity of condition, age   PT Frequency 2x / week   PT Duration Other (comment)  5 weeks   PT Treatment/Interventions Therapeutic exercise;Manual techniques;Aquatic Therapy;Ultrasound;Advice worker;Therapeutic activities;Patient/family education;Neuromuscular re-education;Dry needling   PT Next Visit Plan femoral control, mechanics with stair negotiation, movement, hip strengthening, HEP   Consulted and Agree with Plan of Care Patient       Patient will benefit from skilled therapeutic intervention in order to improve the following deficits and impairments:  Pain, Improper body mechanics, Decreased strength  Visit Diagnosis: Chronic pain of right knee  Chronic pain of left knee  Difficulty in walking, not elsewhere classified  Muscle weakness (generalized)     Problem List There are no active problems to display for this  patient.    Joneen Boers PT, DPT   08/07/2016, 8:21 PM  [facility] [address] [place], [address] Phone: [phone]   Fax:  [phone]  Name: SYMARA BOCCUZZI MRN: [id number] Date of Birth: 14-Jun-1933

## 2016-08-10 ENCOUNTER — Ambulatory Visit: Payer: Medicare Other | Attending: Orthopedic Surgery

## 2016-08-10 DIAGNOSIS — M25561 Pain in right knee: Secondary | ICD-10-CM | POA: Insufficient documentation

## 2016-08-10 DIAGNOSIS — M25562 Pain in left knee: Secondary | ICD-10-CM | POA: Insufficient documentation

## 2016-08-10 DIAGNOSIS — M6281 Muscle weakness (generalized): Secondary | ICD-10-CM | POA: Diagnosis present

## 2016-08-10 DIAGNOSIS — R262 Difficulty in walking, not elsewhere classified: Secondary | ICD-10-CM | POA: Insufficient documentation

## 2016-08-10 DIAGNOSIS — G8929 Other chronic pain: Secondary | ICD-10-CM | POA: Diagnosis present

## 2016-08-10 NOTE — Therapy (Signed)
[facility] [address] [place], [address] Phone: [phone]   Fax:  [phone]  Physical Therapy Treatment  Patient Details  Name: Andrea Hester MRN: [id number] Date of Birth: [birth year] Referring Provider: Governor Specking III, MD  Encounter Date: 08/10/2016      PT End of Session - 08/10/16 1435    Visit Number 13   Number of Visits 31   Date for PT Re-Evaluation 08/31/16   Authorization Type 5   Authorization Time Period of 10   PT Start Time 1435   PT Stop Time 1522   PT Time Calculation (min) 47 min   Activity Tolerance Patient tolerated treatment well   Behavior During Therapy [facility] for tasks assessed/performed      Past Medical History:  Diagnosis Date  . Allergy   . GERD (gastroesophageal reflux disease)   . H/O Clostridium difficile infection   . History of palpitations   . Hyperlipidemia   . Migraines   . White coat hypertension     Past Surgical History:  Procedure Laterality Date  . GALLBLADDER SURGERY    . TONSILLECTOMY      There were no vitals filed for this visit.      Subjective Assessment - 08/10/16 1437    Subjective R calf is not bad until pressing on the gas pedal. The last exercise (the seated R leg press resisting red band and the seated knee flexion AAROM with ball) last session helped the sciatic nerve.  Had less R sciatic nerve felt better after last session.  R knee is 7/10 R knee currenlty (was less earlier before using the gas pedal), 6/10 R calf, 6.5/10 for L knee. Walking and seated marching in place helps with the knee stiffness.    Pertinent History Bilateral knee pain. Pain began about 4 years ago when pt was going up and down the stairs. Used biofreeze which helped for about a year. 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 Pt states that her knees progressively worsened. Pt also has difficulty  going up and down stairs to do laundry due to her knee pain.  Has not had any imaging for her knees.  Pt also states she might have pulled a muscle behind her R thigh from a balance exercise (standing and leaning back) that she did.    Patient Stated Goals Be able to get out and get to church. Walk better.    Currently in Pain? Yes   Pain Score 7    Pain Onset More than a month ago                                 PT Education - 08/10/16 1458    Education provided Yes   Education Details ther-ex, HEP   Person(s) Educated Patient   Methods Explanation;Demonstration;Tactile cues;Verbal cues;Handout   Comprehension Returned demonstration;Verbalized understanding        Objectives   There-ex  Directed patient with seated R LE leg press resisting red band 10x3  Possible improvement with R calf symptoms   Sitting with lumbar towel roll x 3 min  No change in sciatic symptoms  Seated R hip extension isometrics 10x3 with 5 second holds  Decreased R gastroc symptoms  Reviewed HEP. Pt demonstrated and verbalized understanding.   Seated knee flexion AAROM using ball 10x2 (  slight increased R gastroc/sciatic symptoms secondary to too much R hip flexion)  Seated knee flexion heel slides on floor 10x2  Standing L shoulder adduction resisting yellow band to decrease R lateral shift posture 10x3 with 5 seconds   T-band rows yellow 10x5 seconds    Decreased knee and calf stiffness.   Pt was recommended to do more of the exercises at home which helps. Pt verbalized understanding.    Improved exercise technique, movement at target joints, use of target muscles after mod verbal, visual, tactile cues.    No R gastroc pain (just tightness) and decreased R knee stiffness after exercises promoting R glute max strengthening, thoracic extension, and posture.           PT Long Term Goals - 07/27/16 1919      PT LONG TERM GOAL #1   Title Patient will have  a decrease in R knee pain to 3/10 or less at worst and L knee pain to 2/10 or less at worst to promote ability to stand up from a chair, ascend and descend stairs, and pick up items from the ground.    Baseline Pain at worst: R knee 7/10, L knee 5/10; 6/10 R knee pain at worst, 5/10 L knee pain at worst (06/29/2016 and 07/27/2016);    Time 5   Period Weeks   Status On-going     PT LONG TERM GOAL #2   Title Patient will improve her LEFS score by at least 9 points as a demonstration of improved function.    Baseline 26/80; 29/80 (07/25/2016)   Time 5   Period Weeks   Status On-going     PT LONG TERM GOAL #3   Title Patient will improve bilateral hip abduction and knee flexion strength by at least 1/2 MMT grade to promote ability to negotiate stairs and pick up items from the floor.    Time 6   Period Weeks   Status Achieved     PT LONG TERM GOAL #4   Title Patient will be able to ascend and descend 4 regular steps 3 times with bilateral UE assist with minimal to no complain of knee pain to promote ability to go up and down her stairs at home.    Baseline Increased bilateral knee pain with stair negotiation. Able to ascend and descend 4 regular steps with bilateral UE assist mod I with improved femoral control and without knee pain (after manual therapy)   Time 5   Period Weeks   Status On-going               Plan - 08/10/16 1459    Clinical Impression Statement No R gastroc pain (just tightness) and decreased R knee stiffness after exercises promoting R glute max strengthening, thoracic extension, and posture.    Rehab Potential Good   Clinical Impairments Affecting Rehab Potential Chronicity of condition, age   PT Frequency 2x / week   PT Duration Other (comment)  5 weeks   PT Treatment/Interventions Therapeutic exercise;Manual techniques;Aquatic Therapy;Ultrasound;Advice worker;Therapeutic activities;Patient/family education;Neuromuscular  re-education;Dry needling   PT Next Visit Plan femoral control, mechanics with stair negotiation, movement, hip strengthening, HEP   Consulted and Agree with Plan of Care Patient      Patient will benefit from skilled therapeutic intervention in order to improve the following deficits and impairments:  Pain, Improper body mechanics, Decreased strength  Visit Diagnosis: Chronic pain of right knee  Chronic pain of left knee  Difficulty in walking, not  elsewhere classified  Muscle weakness (generalized)     Problem List There are no active problems to display for this patient.  Joneen Boers PT, DPT   08/10/2016, 3:34 PM  [facility] [address] [place], [address] Phone: [phone]   Fax:  [phone]  Name: Andrea Hester MRN: [id number] Date of Birth: [birth year]

## 2016-08-10 NOTE — Patient Instructions (Addendum)
   EXTENSION: Sitting - Resistance Band (Active)    Hold red band with your hands. Squeeze your rear end muscles.   Sit, right knee raised. Against red resistance band AROUND FOOT (not shown), push foot toward floor. Complete __3_ sets of ___10 repetitions. Perform ___1 sessions per day.  Copyright  [employer]. All rights reserved.     Scapular Retraction: Rowing (Eccentric) - Arms - 45 Degrees (Resistance Band)   Hold end of band in each hand. Pull back until elbows are even with trunk. Keep elbows out from sides at 45, thumbs up.  Hold for 5 seconds.   Use ____yellow____ resistance band. _10__ reps per set, _2__ sets per day. Copyright  [employer]. All rights reserved.       Gave seated R hip extension isometrics 10x3 with 5 second holds daily as part of her HEP. Pt demonstrated and verbalized understanding)  Gave seated knee flexion heel slides as part of her HEP 10x3 daily. Pt demonstrated and verbalized understanding.

## 2016-08-17 ENCOUNTER — Ambulatory Visit: Payer: Medicare Other

## 2016-08-17 DIAGNOSIS — R262 Difficulty in walking, not elsewhere classified: Secondary | ICD-10-CM

## 2016-08-17 DIAGNOSIS — M6281 Muscle weakness (generalized): Secondary | ICD-10-CM

## 2016-08-17 DIAGNOSIS — G8929 Other chronic pain: Secondary | ICD-10-CM

## 2016-08-17 DIAGNOSIS — M25561 Pain in right knee: Secondary | ICD-10-CM | POA: Diagnosis not present

## 2016-08-17 DIAGNOSIS — M25562 Pain in left knee: Secondary | ICD-10-CM

## 2016-08-17 NOTE — Therapy (Signed)
[facility] [facility] [address], [place], [address] Phone: [phone]   Fax:  [phone]  Physical Therapy Treatment  Patient Details  Name: Andrea Hester MRN: [id number] Date of Birth: 06/10/1933 Referring Provider: Governor Specking III, MD  Encounter Date: 08/17/2016      PT End of Session - 08/17/16 1118    Visit Number 14   Number of Visits 31   Date for PT Re-Evaluation 08/31/16   Authorization Type 6   Authorization Time Period of 10   PT Start Time 1118   PT Stop Time 1206   PT Time Calculation (min) 48 min   Activity Tolerance Patient tolerated treatment well   Behavior During Therapy [facility] for tasks assessed/performed      Past Medical History:  Diagnosis Date  . Allergy   . GERD (gastroesophageal reflux disease)   . H/O Clostridium difficile infection   . History of palpitations   . Hyperlipidemia   . Migraines   . White coat hypertension     Past Surgical History:  Procedure Laterality Date  . GALLBLADDER SURGERY    . TONSILLECTOMY      There were no vitals filed for this visit.      Subjective Assessment - 08/17/16 1121    Subjective L knee is good. R calf is 3.5/10 (was better before getting into the car). Survived going to the [employer] last week. Sciatic nerve seems to act up when she goes to bed (lies on her back). Uses heat on that area. Felt good after last session but pain acted up that night.  The exercise when she presses her R foot on the floor while sitting helps.  2/10 stiffness and slight pain L knee, 4/10 R knee pain/stiffness (also feels stiffness posterior knee) currently.    Pertinent History Bilateral knee pain. Pain began about 4 years ago when pt was going up and down the stairs. Used biofreeze which helped for about a year. 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 Pt states that her knees progressively  worsened. Pt also has difficulty going up and down stairs to do laundry due to her knee pain.  Has not had any imaging for her knees.  Pt also states she might have pulled a muscle behind her R thigh from a balance exercise (standing and leaning back) that she did.    Patient Stated Goals Be able to get out and get to church. Walk better.    Currently in Pain? Yes   Pain Score 4    Pain Onset More than a month ago                                 PT Education - 08/17/16 1326    Education provided Yes   Education Details ther-ex, HEP   Person(s) Educated Patient   Methods Explanation;Demonstration;Tactile cues;Verbal cues   Comprehension Verbalized understanding;Returned demonstration        Objectives  R lumbar paraspinals more palpable compared to L in sitting  There-ex  Directed patient with seated manually resisted R LE leg press 10x3   No R gastroc pain. R posterior thigh tightness (centralizing symptoms) Sitting on table: posterior/anterior pelvic tilting 10x3 each way  Decreased R posterior thigh tightness. Now feeling R posterior hip tightness (centralization of symptoms)   Supine posterior pelvic tilt  10x3 Supine naval ins (transversus abdominis contraction) 10x3 with 5 seconds  Then with pelvic floor contractions 10x5 seconds, then 8x5 seconds (slight increase in R posterior thigh sciatic symptoms.   Then just supine naval ins only again 10x5 seconds for 2 sets. Slight decrease in symptoms.   Sitting with towels under thighs to promote lumbar flexion in sitting (such as when she is sitting and driving her car)  Decreased R sciatic symptoms. Reviewed and given as part of her HEP. Pt demonstrated and verbalized understanding.    Pt was also recommended to place pillows underneath her knees when lying on her back to sleep to help decrease symptoms. Pt verbalized understanding.     Improved exercise technique, movement at target joints, use of  target muscles after mod verbal, visual, tactile cues.     Centralization of symptoms with activation of R glute max and abdominial muscles and movements that decrease low back extension pressure.  Decreased R knee stiffness after session.  .        PT Long Term Goals - 07/27/16 1919      PT LONG TERM GOAL #1   Title Patient will have a decrease in R knee pain to 3/10 or less at worst and L knee pain to 2/10 or less at worst to promote ability to stand up from a chair, ascend and descend stairs, and pick up items from the ground.    Baseline Pain at worst: R knee 7/10, L knee 5/10; 6/10 R knee pain at worst, 5/10 L knee pain at worst (06/29/2016 and 07/27/2016);    Time 5   Period Weeks   Status On-going     PT LONG TERM GOAL #2   Title Patient will improve her LEFS score by at least 9 points as a demonstration of improved function.    Baseline 26/80; 29/80 (07/25/2016)   Time 5   Period Weeks   Status On-going     PT LONG TERM GOAL #3   Title Patient will improve bilateral hip abduction and knee flexion strength by at least 1/2 MMT grade to promote ability to negotiate stairs and pick up items from the floor.    Time 6   Period Weeks   Status Achieved     PT LONG TERM GOAL #4   Title Patient will be able to ascend and descend 4 regular steps 3 times with bilateral UE assist with minimal to no complain of knee pain to promote ability to go up and down her stairs at home.    Baseline Increased bilateral knee pain with stair negotiation. Able to ascend and descend 4 regular steps with bilateral UE assist mod I with improved femoral control and without knee pain (after manual therapy)   Time 5   Period Weeks   Status On-going               Plan - 08/17/16 1327    Clinical Impression Statement Centralization of symptoms with activation of R glute max and abdominial muscles and movements that decrease low back extension pressure.  Decreased R knee stiffness after  session.   Rehab Potential Good   Clinical Impairments Affecting Rehab Potential Chronicity of condition, age   PT Frequency 2x / week   PT Duration Other (comment)  5 weeks   PT Treatment/Interventions Therapeutic exercise;Manual techniques;Aquatic Therapy;Ultrasound;Advice worker;Therapeutic activities;Patient/family education;Neuromuscular re-education;Dry needling   PT Next Visit Plan femoral control, mechanics with stair negotiation, movement, hip strengthening, HEP   Consulted  and Agree with Plan of Care Patient      Patient will benefit from skilled therapeutic intervention in order to improve the following deficits and impairments:  Pain, Improper body mechanics, Decreased strength  Visit Diagnosis: Chronic pain of right knee  Chronic pain of left knee  Difficulty in walking, not elsewhere classified  Muscle weakness (generalized)     Problem List There are no active problems to display for this patient.   Joneen Boers PT, DPT   08/17/2016, 1:30 PM  [facility] [address], [place], [address] Phone: [phone]   Fax:  [phone]  Name: Andrea Hester MRN: [id number] Date of Birth: 11/01/1932

## 2016-08-17 NOTE — Patient Instructions (Addendum)
  PELVIC TILT: Posterior    Tighten abdominals, flatten low back.     Hold for 5 seconds.  _10__ reps per set, __3_ sets per day   Copyright  VHI. All rights reserved.    Sitting on your car seat:   Place a pillow under your thighs to help tilt your pelvis back

## 2016-08-22 ENCOUNTER — Ambulatory Visit: Payer: Medicare Other

## 2016-08-22 DIAGNOSIS — G8929 Other chronic pain: Secondary | ICD-10-CM

## 2016-08-22 DIAGNOSIS — M25562 Pain in left knee: Secondary | ICD-10-CM

## 2016-08-22 DIAGNOSIS — M6281 Muscle weakness (generalized): Secondary | ICD-10-CM

## 2016-08-22 DIAGNOSIS — M25561 Pain in right knee: Principal | ICD-10-CM

## 2016-08-22 DIAGNOSIS — R262 Difficulty in walking, not elsewhere classified: Secondary | ICD-10-CM

## 2016-08-22 NOTE — Therapy (Signed)
[place] [facility] [address] [place], [address] Phone: [phone]   Fax:  [phone]  Physical Therapy Treatment  Patient Details  Name: Andrea Hester MRN: [id number] Date of Birth: 1932-12-26 Referring Provider: Governor Specking III, MD  Encounter Date: 08/22/2016      PT End of Session - 08/22/16 1356    Visit Number 15   Number of Visits 31   Date for PT Re-Evaluation 08/31/16   Authorization Type 7   Authorization Time Period of 10   PT Start Time [id number]   PT Stop Time 1436   PT Time Calculation (min) 40 min   Activity Tolerance Patient tolerated treatment well   Behavior During Therapy [facility] for tasks assessed/performed      Past Medical History:  Diagnosis Date  . Allergy   . GERD (gastroesophageal reflux disease)   . H/O Clostridium difficile infection   . History of palpitations   . Hyperlipidemia   . Migraines   . White coat hypertension     Past Surgical History:  Procedure Laterality Date  . GALLBLADDER SURGERY    . TONSILLECTOMY      There were no vitals filed for this visit.      Subjective Assessment - 08/22/16 1359    Subjective R knee is hurting and stiff now. The last exercise from last session (trunk flexion and tightening up the belly button) helped with the sciatic symptoms.  The towel under the seat helps her R calf.  Pt can sleep an night not needing the heating pad.  No pain but just tightness R calf.  5/10 R knee, 1-2/10 L knee currently.    Pertinent History Bilateral knee pain. Pain began about 4 years ago when pt was going up and down the stairs. Used biofreeze which helped for about a year. 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 Pt states that her knees progressively worsened. Pt also has difficulty going up and down stairs to do laundry due to her knee pain.  Has not had any imaging for her knees.  Pt also  states she might have pulled a muscle behind her R thigh from a balance exercise (standing and leaning back) that she did.    Patient Stated Goals Be able to get out and get to church. Walk better.    Currently in Pain? Yes   Pain Score 5    Pain Onset More than a month ago                                 PT Education - 08/22/16 2138    Education provided Yes   Education Details ther-ex, HEP   Person(s) Educated Patient   Methods Explanation;Demonstration;Tactile cues;Verbal cues;Handout   Comprehension Verbalized understanding;Returned demonstration        Objectives  Manual therapy  Medial glide R patella in sitting grade 3   There-ex  Directed patient with seated PROM R knee flexion 10x3  Then AAROM to end range with use of R hamstring muscles 10x3 Supine posterior pelvic tilt with hip fallouts 5x4 each LE  Difficulty with bilateral thigh control. L pelvic rotation with L hip fall outs as well.   Increased time secondary to emphasis on proper movement.    Reviewed and given as part of her HEP. Pt demonstrated and verbalized understanding.  Improved exercise technique, movement at target joints, use of target muscles after mod verbal, visual, tactile cues.    Improving R sciatic symptoms. Improved R knee flexion AROM after manual therapy to promote medial glide to R patella and after flexion PROM and AAROM exercises. Difficulty with lumbopelvic control and hip pelvic dissociation.           PT Long Term Goals - 07/27/16 1919      PT LONG TERM GOAL #1   Title Patient will have a decrease in R knee pain to 3/10 or less at worst and L knee pain to 2/10 or less at worst to promote ability to stand up from a chair, ascend and descend stairs, and pick up items from the ground.    Baseline Pain at worst: R knee 7/10, L knee 5/10; 6/10 R knee pain at worst, 5/10 L knee pain at worst (06/29/2016 and 07/27/2016);    Time 5   Period Weeks    Status On-going     PT LONG TERM GOAL #2   Title Patient will improve her LEFS score by at least 9 points as a demonstration of improved function.    Baseline 26/80; 29/80 (07/25/2016)   Time 5   Period Weeks   Status On-going     PT LONG TERM GOAL #3   Title Patient will improve bilateral hip abduction and knee flexion strength by at least 1/2 MMT grade to promote ability to negotiate stairs and pick up items from the floor.    Time 6   Period Weeks   Status Achieved     PT LONG TERM GOAL #4   Title Patient will be able to ascend and descend 4 regular steps 3 times with bilateral UE assist with minimal to no complain of knee pain to promote ability to go up and down her stairs at home.    Baseline Increased bilateral knee pain with stair negotiation. Able to ascend and descend 4 regular steps with bilateral UE assist mod I with improved femoral control and without knee pain (after manual therapy)   Time 5   Period Weeks   Status On-going               Plan - 08/22/16 2139    Clinical Impression Statement Improving R sciatic symptoms. Improved R knee flexion AROM after manual therapy to promote medial glide to R patella and after flexion PROM and AAROM exercises. Difficulty with lumbopelvic control and hip pelvic dissociation.    Rehab Potential Good   Clinical Impairments Affecting Rehab Potential Chronicity of condition, age   PT Frequency 2x / week   PT Duration Other (comment)  5 weeks   PT Treatment/Interventions Therapeutic exercise;Manual techniques;Aquatic Therapy;Ultrasound;Advice worker;Therapeutic activities;Patient/family education;Neuromuscular re-education;Dry needling   PT Next Visit Plan femoral control, mechanics with stair negotiation, movement, hip strengthening, HEP   Consulted and Agree with Plan of Care Patient      Patient will benefit from skilled therapeutic intervention in order to improve the following deficits and  impairments:  Pain, Improper body mechanics, Decreased strength  Visit Diagnosis: Chronic pain of right knee  Chronic pain of left knee  Difficulty in walking, not elsewhere classified  Muscle weakness (generalized)     Problem List There are no active problems to display for this patient.   Joneen Boers PT, DPT   08/22/2016, 9:45 PM  [facility] [address] [place], [address]  Phone: [phone]   Fax:  [phone]  Name: Andrea Hester MRN: [id number] Date of Birth: 09/01/1933

## 2016-08-22 NOTE — Patient Instructions (Addendum)
Reviewed and gave supine hip fallouts 10x3, 5 seconds for 3x/day, 5 days per week as part of her HEP. Pt demonstrated and verbalized understanding. Handout provided.

## 2016-08-29 ENCOUNTER — Ambulatory Visit: Payer: Medicare Other

## 2016-08-29 DIAGNOSIS — M6281 Muscle weakness (generalized): Secondary | ICD-10-CM

## 2016-08-29 DIAGNOSIS — G8929 Other chronic pain: Secondary | ICD-10-CM

## 2016-08-29 DIAGNOSIS — M25562 Pain in left knee: Secondary | ICD-10-CM

## 2016-08-29 DIAGNOSIS — M25561 Pain in right knee: Secondary | ICD-10-CM | POA: Diagnosis not present

## 2016-08-29 DIAGNOSIS — R262 Difficulty in walking, not elsewhere classified: Secondary | ICD-10-CM

## 2016-08-29 NOTE — Therapy (Signed)
[place] [facility] [address], [place], [address] Phone: [phone]   Fax:  [phone]  Physical Therapy Treatment  Patient Details  Name: Andrea Hester MRN: [id number] Date of Birth: May 04, 1933 Referring Provider: Governor Specking III, MD  Encounter Date: 08/29/2016      PT End of Session - 08/29/16 1522    Visit Number 16   Number of Visits 43   Date for PT Re-Evaluation 10/12/16   Authorization Type 8   Authorization Time Period of 10   PT Start Time 1522   PT Stop Time 1613   PT Time Calculation (min) 51 min   Activity Tolerance Patient tolerated treatment well   Behavior During Therapy [facility] for tasks assessed/performed      Past Medical History:  Diagnosis Date  . Allergy   . GERD (gastroesophageal reflux disease)   . H/O Clostridium difficile infection   . History of palpitations   . Hyperlipidemia   . Migraines   . White coat hypertension     Past Surgical History:  Procedure Laterality Date  . GALLBLADDER SURGERY    . TONSILLECTOMY      There were no vitals filed for this visit.      Subjective Assessment - 08/29/16 1524    Subjective L knee feels good 1.5 to 2/10 currently (2/10 at worst without exercises. Exercises brings pain down). Both knees almost felt normal after last session which lasted that night. The R sciatica has not been bothering her, has not had R calf pain. The exercises helps the R knee a little bit  Does not have to sleep with a heating pad anymore. 3.5/10 R knee pain currently (4.5/10 R knee at most for the past 7 days, not counting the mornring stiffness). Pt states that she feels much better compared to before starting physical therapy.    Pertinent History Bilateral knee pain. Pain began about 4 years ago when pt was going up and down the stairs. Used biofreeze which helped for about a year. 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 Pt states that her knees progressively worsened. Pt also has difficulty going up and down stairs to do laundry due to her knee pain.  Has not had any imaging for her knees.  Pt also states she might have pulled a muscle behind her R thigh from a balance exercise (standing and leaning back) that she did.    Patient Stated Goals Be able to get out and get to church. Walk better. Pt adds that she wants to get rid of her cane.    Currently in Pain? Yes   Pain Score 4   3.5/10   Pain Onset More than a month ago            [facility] PT Assessment - 08/29/16 1700      Observation/Other Assessments   Lower Extremity Functional Scale  33/80                             PT Education - 08/29/16 1547    Education provided Yes   Education Details ther-ex, HEP, progress/current status towards goals, plan of care   Person(s) Educated Patient   Methods Explanation;Demonstration;Tactile cues;Verbal cues;Handout   Comprehension Returned demonstration;Verbalized understanding        Objectives   There-ex  Reviewed plan of care: continue  PT for 2x/week for 6 weeks to continue progress.   S/L R hip abduction 10x, then 5x2  No R knee pain afterwards.   S/L L hip abduction 5x2. L calf discomfort which eased with rest.   Ascending and descending 4 regualr steps with bilateral UE assist 3x reciprocal pattern. Good femoral control. Pt states that her steps at home are a little higher. Knees did not bother that bad. Minimal pain.   Reviewed progress/current status with PT towards goals.   Gait 32 ft without AD, but CGA 4x  Side stepping 32 ft to the L and 32 ft to the R CGA to SBA  Gait around three 8 inch cones 2x without AD. Max cues for proper technique, mod cues for increasing base of support.  Dizziness which eases with rest.   BP L arm sitting 158/84, HR 78 BPM. No complain of dizziness afterwards   Improved exercise technique, movement  at target joints, use of target muscles after mod verbal, visual, tactile cues.     Pt demonstrates overall decreasing bilateral knee pain and R sciatic pain, improved ability to ascend and descend stairs with improved femoral control and reciprocal pattern with decreased pain, improved step length and steadiness with less shuffling when walking without her AD, improved function. Pt making very good progress with physical therapy towards goals. Pt still demonstrates bilateral knee pain, difficulty performing functional tasks, and difficulty ambulating without an AD and would benefit from continued skilled physical therapy services to address the aforementioned deficits.        PT Long Term Goals - 08/29/16 1551      PT LONG TERM GOAL #1   Title Patient will have a decrease in R knee pain to 3/10 or less at worst and L knee pain to 2/10 or less at worst to promote ability to stand up from a chair, ascend and descend stairs, and pick up items from the ground.    Baseline Pain at worst: R knee 7/10, L knee 5/10; 6/10 R knee pain at worst, 5/10 L knee pain at worst (06/29/2016 and 07/27/2016); L knee pain 2/10 at worst,  4.5/10  R knee pain at most for the past 7 days (not counting the morning stiffness) 08/29/2016.    Time 6   Period Weeks   Status Partially Met     PT LONG TERM GOAL #2   Title Patient will improve her LEFS score by at least 9 points as a demonstration of improved function.    Baseline 26/80; 29/80 (07/25/2016); 33/80 (08/29/2016)   Time 6   Period Weeks   Status On-going     PT LONG TERM GOAL #3   Title Patient will improve bilateral hip abduction and knee flexion strength by at least 1/2 MMT grade to promote ability to negotiate stairs and pick up items from the floor.    Time 6   Period Weeks   Status Achieved     PT LONG TERM GOAL #4   Title Patient will be able to ascend and descend 4 regular steps 3 times with bilateral UE assist with minimal to no complain of knee  pain to promote ability to go up and down her stairs at home.    Baseline Increased bilateral knee pain with stair negotiation. Able to ascend and descend 4 regular steps with bilateral UE assist mod I with improved femoral control and without knee pain (after manual therapy). Able to perform today (after hip exercises) with minimal to  no reports of knee pain with reciprocal gait pattern (08/29/2016)   Time 5   Period Weeks   Status Achieved     PT LONG TERM GOAL #5   Title Patient will be able to ambulate at least 500 ft without LOB and without use of AD.    Baseline Pt currently ambulates with a SPC.    Time 6   Period Weeks   Status New               Plan - 08/29/16 1636    Clinical Impression Statement Pt demonstrates overall decreasing bilateral knee pain and R sciatic pain, improved ability to ascend and descend stairs with improved femoral control and reciprocal pattern with decreased pain, improved step length and steadiness with less shuffling when walking without her AD, improved function. Pt making very good progress with physical therapy towards goals. Pt still demonstrates bilateral knee pain, difficulty performing functional tasks, and difficulty ambulating without an AD and would benefit from continued skilled physical therapy services to address the aforementioned deficits.    Rehab Potential Good   Clinical Impairments Affecting Rehab Potential Chronicity of condition, age   PT Frequency 2x / week   PT Duration 6 weeks   PT Treatment/Interventions Therapeutic exercise;Manual techniques;Aquatic Therapy;Ultrasound;Advice worker;Therapeutic activities;Patient/family education;Neuromuscular re-education;Dry needling   PT Next Visit Plan femoral control, mechanics with stair negotiation, movement, hip strengthening, HEP   Consulted and Agree with Plan of Care Patient      Patient will benefit from skilled therapeutic intervention in order to  improve the following deficits and impairments:  Pain, Improper body mechanics, Decreased strength  Visit Diagnosis: Chronic pain of right knee - Plan: PT plan of care cert/re-cert  Chronic pain of left knee - Plan: PT plan of care cert/re-cert  Difficulty in walking, not elsewhere classified - Plan: PT plan of care cert/re-cert  Muscle weakness (generalized) - Plan: PT plan of care cert/re-cert     Problem List There are no active problems to display for this patient.   Joneen Boers PT, DPT   08/29/2016, 5:04 PM  [place] [address], [place], [address] Phone: [phone]   Fax:  [phone]  Name: Andrea Hester MRN: [id number] Date of Birth: 10-Nov-1932

## 2016-08-29 NOTE — Patient Instructions (Signed)
Abduction: Side Leg Lift  - Side-Lying    Lie on side. Lift top leg (right) slightly higher than shoulder level. Keep top leg straight with body, toes pointing forward to feel muscles work at the side of your right hip.   Do not let your pelvis roll   _5_ reps per set, __3_ sets per day, _5__ days per week.  http://ecce.exer.us/62   Copyright  VHI. All rights reserved.

## 2016-09-11 ENCOUNTER — Ambulatory Visit: Payer: Medicare Other | Attending: Orthopedic Surgery

## 2016-09-11 DIAGNOSIS — M25562 Pain in left knee: Secondary | ICD-10-CM | POA: Insufficient documentation

## 2016-09-11 DIAGNOSIS — M6281 Muscle weakness (generalized): Secondary | ICD-10-CM | POA: Diagnosis present

## 2016-09-11 DIAGNOSIS — R262 Difficulty in walking, not elsewhere classified: Secondary | ICD-10-CM | POA: Diagnosis present

## 2016-09-11 DIAGNOSIS — M25561 Pain in right knee: Secondary | ICD-10-CM | POA: Diagnosis present

## 2016-09-11 DIAGNOSIS — G8929 Other chronic pain: Secondary | ICD-10-CM | POA: Diagnosis present

## 2016-09-11 NOTE — Patient Instructions (Signed)
   Seated pelvic tilting.    Sitting on a chair, slouch down,     Then sit up straight to feel your pelvis move.     Repeat 10 time each way.    Perform 3 sets of 10 or more daily.

## 2016-09-11 NOTE — Therapy (Signed)
[facility] [address] [place], [address] Phone: [phone]   Fax:  [phone]  Physical Therapy Treatment  Patient Details  Name: Andrea Hester MRN: [id number] Date of Birth: 1933-05-28 Referring Provider: Governor Specking III, MD  Encounter Date: 09/11/2016      PT End of Session - 09/11/16 1406    Visit Number 17   Number of Visits 43   Date for PT Re-Evaluation 10/12/16   Authorization Type 9   Authorization Time Period of 10   PT Start Time 1406  pt arrived late   PT Stop Time 1449   PT Time Calculation (min) 43 min   Activity Tolerance Patient tolerated treatment well   Behavior During Therapy [facility] for tasks assessed/performed      Past Medical History:  Diagnosis Date  . Allergy   . GERD (gastroesophageal reflux disease)   . H/O Clostridium difficile infection   . History of palpitations   . Hyperlipidemia   . Migraines   . White coat hypertension     Past Surgical History:  Procedure Laterality Date  . GALLBLADDER SURGERY    . TONSILLECTOMY      There were no vitals filed for this visit.      Subjective Assessment - 09/11/16 1408    Subjective Pt states that her R sciatic nerve might have increased with the R hip abduction exercise. 6/10 R LE currently (7/10 last week). L knee 5/10 currently.    Pertinent History Bilateral knee pain. Pain began about 4 years ago when pt was going up and down the stairs. Used biofreeze which helped for about a year. 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 Pt states that her knees progressively worsened. Pt also has difficulty going up and down stairs to do laundry due to her knee pain.  Has not had any imaging for her knees.  Pt also states she might have pulled a muscle behind her R thigh from a balance exercise (standing and leaning back) that she did.    Patient Stated Goals Be able  to get out and get to church. Walk better. Pt adds that she wants to get rid of her cane.    Currently in Pain? Yes   Pain Score 6    Pain Onset More than a month ago                                 PT Education - 09/11/16 1416    Education provided Yes   Education Details ther-ex, HEP   Person(s) Educated Patient   Methods Explanation;Demonstration;Tactile cues;Verbal cues;Handout   Comprehension Returned demonstration;Verbalized understanding        Objectives   There-ex  Directed patient with seated R hip extension isometrics 10x10 seconds for 2 sets  Seated trunk flexion on chair 1 min to promote lumbar flexion  Decreased R posterior thigh symptoms.   seated manually resisted R LE leg press 10x3   Decreased symptoms R LE   Then 10x3 more. Decreased symptoms more.   Sitting on table: posterior/anterior pelvic tilting 10x3 each way   Improved exercise technique, movement at target joints, use of target muscles after mod verbal, visual, tactile cues.       Manual therapy  Medial glide to R patella grade 3. Decreased R knee pain to 0/10  afterwards   Decreased R LE symptoms and R knee pain after exercises to promote lumbar flexion, R glute max use, and manual therapy to promote patellar glide.                  PT Long Term Goals - 08/29/16 1551      PT LONG TERM GOAL #1   Title Patient will have a decrease in R knee pain to 3/10 or less at worst and L knee pain to 2/10 or less at worst to promote ability to stand up from a chair, ascend and descend stairs, and pick up items from the ground.    Baseline Pain at worst: R knee 7/10, L knee 5/10; 6/10 R knee pain at worst, 5/10 L knee pain at worst (06/29/2016 and 07/27/2016); L knee pain 2/10 at worst,  4.5/10  R knee pain at most for the past 7 days (not counting the morning stiffness) 08/29/2016.    Time 6   Period Weeks   Status Partially Met     PT LONG TERM GOAL #2   Title  Patient will improve her LEFS score by at least 9 points as a demonstration of improved function.    Baseline 26/80; 29/80 (07/25/2016); 33/80 (08/29/2016)   Time 6   Period Weeks   Status On-going     PT LONG TERM GOAL #3   Title Patient will improve bilateral hip abduction and knee flexion strength by at least 1/2 MMT grade to promote ability to negotiate stairs and pick up items from the floor.    Time 6   Period Weeks   Status Achieved     PT LONG TERM GOAL #4   Title Patient will be able to ascend and descend 4 regular steps 3 times with bilateral UE assist with minimal to no complain of knee pain to promote ability to go up and down her stairs at home.    Baseline Increased bilateral knee pain with stair negotiation. Able to ascend and descend 4 regular steps with bilateral UE assist mod I with improved femoral control and without knee pain (after manual therapy). Able to perform today (after hip exercises) with minimal to no reports of knee pain with reciprocal gait pattern (08/29/2016)   Time 5   Period Weeks   Status Achieved     PT LONG TERM GOAL #5   Title Patient will be able to ambulate at least 500 ft without LOB and without use of AD.    Baseline Pt currently ambulates with a SPC.    Time 6   Period Weeks   Status New               Plan - 09/11/16 1417    Clinical Impression Statement Decreased R LE symptoms and R knee pain after exercises to promote lumbar flexion, R glute max use, and manual therapy to promote patellar glide.    Rehab Potential Good   Clinical Impairments Affecting Rehab Potential Chronicity of condition, age   PT Frequency 2x / week   PT Duration 6 weeks   PT Treatment/Interventions Therapeutic exercise;Manual techniques;Aquatic Therapy;Ultrasound;Advice worker;Therapeutic activities;Patient/family education;Neuromuscular re-education;Dry needling   PT Next Visit Plan femoral control, mechanics with stair negotiation,  movement, hip strengthening, HEP   Consulted and Agree with Plan of Care Patient      Patient will benefit from skilled therapeutic intervention in order to improve the following deficits and impairments:  Pain, Improper body mechanics, Decreased strength  Visit Diagnosis: Chronic pain of right knee  Chronic pain of left knee  Difficulty in walking, not elsewhere classified  Muscle weakness (generalized)     Problem List There are no active problems to display for this patient.  Joneen Boers PT, DPT   09/11/2016, 6:55 PM  [facility] [address], [place], [address] Phone: [phone]   Fax:  [phone]  Name: Andrea Hester MRN: [id number] Date of Birth: 04-Oct-1933

## 2016-09-13 ENCOUNTER — Ambulatory Visit: Payer: Medicare Other

## 2016-09-13 DIAGNOSIS — M25562 Pain in left knee: Secondary | ICD-10-CM

## 2016-09-13 DIAGNOSIS — M25561 Pain in right knee: Principal | ICD-10-CM

## 2016-09-13 DIAGNOSIS — R262 Difficulty in walking, not elsewhere classified: Secondary | ICD-10-CM

## 2016-09-13 DIAGNOSIS — M6281 Muscle weakness (generalized): Secondary | ICD-10-CM

## 2016-09-13 DIAGNOSIS — G8929 Other chronic pain: Secondary | ICD-10-CM

## 2016-09-13 NOTE — Therapy (Signed)
[facility] [address] [place], [address] Phone: [phone]   Fax:  [phone]  Physical Therapy Treatment And Progress Report  Patient Details  Name: Andrea Hester MRN: [id number] Date of Birth: 1933-05-31 Referring Provider: Governor Specking III, MD  Encounter Date: 09/13/2016      PT End of Session - 09/13/16 1348    Visit Number 17   Number of Visits 43   Date for PT Re-Evaluation 10/12/16   Authorization Type 1   Authorization Time Period of 10   PT Start Time 1349   PT Stop Time 1452   PT Time Calculation (min) 63 min   Activity Tolerance Patient tolerated treatment well   Behavior During Therapy [facility] for tasks assessed/performed      Past Medical History:  Diagnosis Date  . Allergy   . GERD (gastroesophageal reflux disease)   . H/O Clostridium difficile infection   . History of palpitations   . Hyperlipidemia   . Migraines   . White coat hypertension     Past Surgical History:  Procedure Laterality Date  . GALLBLADDER SURGERY    . TONSILLECTOMY      There were no vitals filed for this visit.      Subjective Assessment - 09/13/16 1355    Subjective R calf bothered her yesterday when pressing on the gas pedal. Did seated hip extension which helped. 6/10 R gastroc pain, 5/10 R knee, and 4/10 L knee pain currently.    Pertinent History Bilateral knee pain. Pain began about 4 years ago when pt was going up and down the stairs. Used biofreeze which helped for about a year. 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 Pt states that her knees progressively worsened. Pt also has difficulty going up and down stairs to do laundry due to her knee pain.  Has not had any imaging for her knees.  Pt also states she might have pulled a muscle behind her R thigh from a balance exercise (standing and leaning back) that she did.    Patient  Stated Goals Be able to get out and get to church. Walk better. Pt adds that she wants to get rid of her cane.    Currently in Pain? Yes   Pain Score 6    Pain Onset More than a month ago                                 PT Education - 09/13/16 1432    Education provided Yes   Education Details ther-ex, HEP   Person(s) Educated Patient   Methods Explanation;Demonstration;Tactile cues;Verbal cues;Handout   Comprehension Verbalized understanding;Returned demonstration        Objectives   There-ex  Directed patient with supine posterior pelvic tilts 10x5 seconds  Increased R gastroc symptoms  Supine R lower trunk rotation 5x5 seconds. Slight decrease in symptoms at first but symptoms increased  Supine bilateral shoulder extension isometrics against table 10x5 seconds for 3 sets to promote trunk muscle use. Decreased R LE symptoms.   Reviewed and given as part of her HEP. Pt demonstrated and verbalized understanding.   Supine hip fallouts with emphasis on pelvic control. 5x2 each LE  A lot of time spent on quality of movement secondary to a lot of difficulty controlling L pelvic rotation. Pt was recommended to  hold off on the supine hip fallout HEP. Pt verbalized understanding.    Supine manually resisted L hip flexion/adduction in hooklying 10x5 seconds to target L lower abdomen for 2 sets  Seated thoracic extension over chair, lumbar locked position 10x5 seconds, then 10x  Decreased R gastroc symptoms   Seated R hip extension isometrics with 3 inch step under R foot 10x5 seconds for 2 sets   Seated manually resisted hip adduction 10x5 seconds. Decreased R gastroc pain   Gait around the gym 300 ft without AD but with CGA. Min cues for increasing step length. Stiff bilateral knees at first which improved with increased time walking. No LOB. Improved step length overall since initial evaluation.    Improved exercise technique, movement at target  joints, use of target muscles after mod verbal, visual, tactile cues.     Difficulty with lumbopelvic control. Decreased R gastroc symptoms to 4.5/10 after exercises to promote abdominal muscle and R glute max muscle use and decrease R piriformis muscle activation and decrease low back extension pressure. Pt demonstrates slight increase in bilateral knee pain level at worst since last progress report due to a flare up but overall less than since starting physical therapy. Continued working on decreasing R sciatic symptoms due to sciatic symptoms making progress with R knee pain challenging. Pt however able to ambulate up to 300 ft today without use of her SPC and without LOB. Overall improved step length during gait since initial evaluation and decreased bilateral knee stiffness with increased walking. Patient still demonstrates bilateral knee pain, R LE sciatic symptoms, and difficulty performing functional tasks such as walking and would benefit from continued skilled physical therapy services to address the aforementioned deficits.            PT Long Term Goals - 09/13/16 1442      PT LONG TERM GOAL #1   Title Patient will have a decrease in R knee pain to 3/10 or less at worst and L knee pain to 2/10 or less at worst to promote ability to stand up from a chair, ascend and descend stairs, and pick up items from the ground.    Baseline Pain at worst: R knee 7/10, L knee 5/10; 6/10 R knee pain at worst, 5/10 L knee pain at worst (06/29/2016 and 07/27/2016); L knee pain 2/10 at worst,  4.5/10  R knee pain at most for the past 7 days (not counting the morning stiffness) 08/29/2016.    For the past 7 days: 5.5/10 R knee, 4/10 L knee, (not counting the morning stiffness) and 6/10 R gastroc at most (09/13/2016)   Time 6   Period Weeks   Status Partially Met     PT LONG TERM GOAL #2   Title Patient will improve her LEFS score by at least 9 points as a demonstration of improved function.     Baseline 26/80; 29/80 (07/25/2016); 33/80 (08/29/2016)   Time 6   Period Weeks   Status On-going     PT LONG TERM GOAL #3   Title Patient will improve bilateral hip abduction and knee flexion strength by at least 1/2 MMT grade to promote ability to negotiate stairs and pick up items from the floor.    Time 6   Period Weeks   Status Achieved     PT LONG TERM GOAL #4   Title Patient will be able to ascend and descend 4 regular steps 3 times with bilateral UE assist with minimal to no complain  of knee pain to promote ability to go up and down her stairs at home.    Baseline Increased bilateral knee pain with stair negotiation. Able to ascend and descend 4 regular steps with bilateral UE assist mod I with improved femoral control and without knee pain (after manual therapy). Able to perform today (after hip exercises) with minimal to no reports of knee pain with reciprocal gait pattern (08/29/2016)   Time 5   Period Weeks   Status Achieved     PT LONG TERM GOAL #5   Title Patient will be able to ambulate at least 500 ft without LOB and without use of AD.    Baseline Pt currently ambulates with a SPC. Pt able to ambulate up to 300 ft without SPC (CGA) and without LOB 09-Oct-2016.   Time 6   Period Weeks   Status On-going               Plan - Oct 09, 2016 1432    Clinical Impression Statement Difficulty with lumbopelvic control. Decreased R gastroc symptoms to 4.5/10 after exercises to promote abdominal muscle and R glute max muscle use and decrease R piriformis muscle activation and decrease low back extension pressure. Pt demonstrates slight increase in bilateral knee pain level at worst since last progress report due to a flare up but overall less than since starting physical therapy. Continued working on decreasing R sciatic symptoms due to sciatic symptoms making progress with R knee pain challenging. Pt however able to ambulate up to 300 ft today without use of her SPC and without LOB.  Overall improved step length during gait since initial evaluation and decreased bilateral knee stiffness with increased walking. Patient still demonstrates bilateral knee pain, R LE sciatic symptoms, and difficulty performing functional tasks such as walking and would benefit from continued skilled physical therapy services to address the aforementioned deficits.    Rehab Potential Good   Clinical Impairments Affecting Rehab Potential Chronicity of condition, age   PT Frequency 2x / week   PT Duration 6 weeks   PT Treatment/Interventions Therapeutic exercise;Manual techniques;Aquatic Therapy;Ultrasound;Advice worker;Therapeutic activities;Patient/family education;Neuromuscular re-education;Dry needling   PT Next Visit Plan femoral control, mechanics with stair negotiation, movement, hip strengthening, HEP   Consulted and Agree with Plan of Care Patient      Patient will benefit from skilled therapeutic intervention in order to improve the following deficits and impairments:  Pain, Improper body mechanics, Decreased strength  Visit Diagnosis: Chronic pain of right knee  Chronic pain of left knee  Difficulty in walking, not elsewhere classified  Muscle weakness (generalized)       G-Codes - Oct 09, 2016 1512    Functional Assessment Tool Used LEFS, clinical presentation, patient interview   Functional Limitation Mobility: Walking and moving around   Mobility: Walking and Moving Around Current Status [phone]) At least 60 percent but less than 80 percent impaired, limited or restricted   Mobility: Walking and Moving Around Goal Status [phone]) At least 20 percent but less than 40 percent impaired, limited or restricted      Problem List There are no active problems to display for this patient.  Thank you for your referral.   Joneen Boers PT, DPT   09-Oct-2016, 3:20 PM  [facility] [address], [place], [address] Phone: [phone]   Fax:  [phone]  Name: Andrea Hester MRN: [id number] Date of Birth: June 07, 1933

## 2016-09-13 NOTE — Patient Instructions (Addendum)
   On your back, knees bent and feet on your bed, press both arms back against your bed.    Hold for 5 seconds.   Repeat 10 times.   Perform 3 sets daily.      Pt was recommended to hold off on performing her supine hip fallout exercise. Pt verbalized understanding.    Pt was also recommended to perform seated hip adduction pillow squeeze using a thick pillow 5 seconds comfortably at a time to help decrease R sciatic symptoms. Pt demonstrated and verbalized understanding.

## 2016-09-18 ENCOUNTER — Ambulatory Visit: Payer: Medicare Other

## 2016-09-20 ENCOUNTER — Ambulatory Visit: Payer: Medicare Other

## 2016-09-20 DIAGNOSIS — M25561 Pain in right knee: Principal | ICD-10-CM

## 2016-09-20 DIAGNOSIS — M6281 Muscle weakness (generalized): Secondary | ICD-10-CM

## 2016-09-20 DIAGNOSIS — G8929 Other chronic pain: Secondary | ICD-10-CM

## 2016-09-20 DIAGNOSIS — M25562 Pain in left knee: Secondary | ICD-10-CM

## 2016-09-20 DIAGNOSIS — R262 Difficulty in walking, not elsewhere classified: Secondary | ICD-10-CM

## 2016-09-20 NOTE — Patient Instructions (Signed)
Modified her seated R hip extension isometric home exercise: towel underneath R thigh so she presses her thigh on the towel instead of placing pressure on her R leg (when pressing her R foot onto the floor). Pt demonstrated and verbalized understanding.

## 2016-09-20 NOTE — Therapy (Signed)
[facility] [address], [place], [address] Phone: [phone]   Fax:  [phone]  Physical Therapy Treatment  Patient Details  Name: Andrea Hester MRN: [id number] Date of Birth: 02-13-1933 Referring Provider: Governor Specking III, MD  Encounter Date: 09/20/2016      PT End of Session - 09/20/16 1535    Visit Number 19   Number of Visits 43   Date for PT Re-Evaluation 10/12/16   Authorization Type 2   Authorization Time Period of 10   PT Start Time 1535   PT Stop Time 1618   PT Time Calculation (min) 43 min   Activity Tolerance Patient tolerated treatment well   Behavior During Therapy [facility] for tasks assessed/performed      Past Medical History:  Diagnosis Date  . Allergy   . GERD (gastroesophageal reflux disease)   . H/O Clostridium difficile infection   . History of palpitations   . Hyperlipidemia   . Migraines   . White coat hypertension     Past Surgical History:  Procedure Laterality Date  . GALLBLADDER SURGERY    . TONSILLECTOMY      There were no vitals filed for this visit.      Subjective Assessment - 09/20/16 1537    Subjective R sciatica feels better. Thnks she got carried away with the seated R hip extension isometrics because the bump at her R leg got aggravated.  Pt also states that she felt pain in her R knee prior to wanting to buckle last week. 4-5/10 R calf,  5/10  R knee, 4/10 L knee.    Pertinent History Bilateral knee pain. Pain began about 4 years ago when pt was going up and down the stairs. Used biofreeze which helped for about a year. 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 Pt states that her knees progressively worsened. Pt also has difficulty going up and down stairs to do laundry due to her knee pain.  Has not had any imaging for her knees.  Pt also states she might have pulled a muscle behind her R  thigh from a balance exercise (standing and leaning back) that she did.    Patient Stated Goals Be able to get out and get to church. Walk better. Pt adds that she wants to get rid of her cane.    Currently in Pain? Yes   Pain Score 5    Pain Onset More than a month ago            [facility] PT Assessment - 09/20/16 1905      Observation/Other Assessments   Observations (+) R piriformis test in S/L                             PT Education - 09/20/16 1904    Education provided Yes   Education Details ther-ex, HEP modification   Person(s) Educated Patient   Methods Explanation;Demonstration;Tactile cues;Verbal cues   Comprehension Returned demonstration;Verbalized understanding        Objectives     There-ex  Directed patient with seated R hip extension isometrics towel under R thigh to decrease pressure to R leg 5x 10 seconds   (+) R piriformis test   Seated manually resisted R leg press 10x2  Slight right posterior thigh discomfort which eases with rest  Standing bilateral shoulder extension  resisting yellow band 10x2 with 5 seconds   Gait around gym without AD 100 ft, CGA to SBA. Improved step length  Pt was recommended to squeeze rear-end muscles when driving and to continue to place pillow under thighs to promote posterior pelvic tilting when driving. Pt verbalized understanding.     Improved exercise technique, movement at target joints, use of target muscles after mod verbal, visual, tactile cues.      Manual therapy  STM to R piriformis muscle in L S/L   Decreased sciatic nerve pain to 4/10 after manual therapy.   Medial R patellar glide grade 3+  Decreased R knee pain   Pt states R sciatic symptoms feels better after session. Decreased R knee pain after manual therapy to promote patellar mobility.  Pt was recommended to squeeze rear-end muscles when driving to try to help decrease use of her piriformis muscle.          PT Long Term Goals - 09/13/16 1442      PT LONG TERM GOAL #1   Title Patient will have a decrease in R knee pain to 3/10 or less at worst and L knee pain to 2/10 or less at worst to promote ability to stand up from a chair, ascend and descend stairs, and pick up items from the ground.    Baseline Pain at worst: R knee 7/10, L knee 5/10; 6/10 R knee pain at worst, 5/10 L knee pain at worst (06/29/2016 and 07/27/2016); L knee pain 2/10 at worst,  4.5/10  R knee pain at most for the past 7 days (not counting the morning stiffness) 08/29/2016.    For the past 7 days: 5.5/10 R knee, 4/10 L knee, (not counting the morning stiffness) and 6/10 R gastroc at most (09/13/2016)   Time 6   Period Weeks   Status Partially Met     PT LONG TERM GOAL #2   Title Patient will improve her LEFS score by at least 9 points as a demonstration of improved function.    Baseline 26/80; 29/80 (07/25/2016); 33/80 (08/29/2016)   Time 6   Period Weeks   Status On-going     PT LONG TERM GOAL #3   Title Patient will improve bilateral hip abduction and knee flexion strength by at least 1/2 MMT grade to promote ability to negotiate stairs and pick up items from the floor.    Time 6   Period Weeks   Status Achieved     PT LONG TERM GOAL #4   Title Patient will be able to ascend and descend 4 regular steps 3 times with bilateral UE assist with minimal to no complain of knee pain to promote ability to go up and down her stairs at home.    Baseline Increased bilateral knee pain with stair negotiation. Able to ascend and descend 4 regular steps with bilateral UE assist mod I with improved femoral control and without knee pain (after manual therapy). Able to perform today (after hip exercises) with minimal to no reports of knee pain with reciprocal gait pattern (08/29/2016)   Time 5   Period Weeks   Status Achieved     PT LONG TERM GOAL #5   Title Patient will be able to ambulate at least 500 ft without LOB and without use of  AD.    Baseline Pt currently ambulates with a SPC. Pt able to ambulate up to 300 ft without SPC (CGA) and without LOB 09/13/2016.   Time 6  Period Weeks   Status On-going               Plan - 09/20/16 1532    Clinical Impression Statement Pt states R sciatic symptoms feels better after session. Decreased R knee pain after manual therapy to promote patellar mobility.  Pt was recommended to squeeze rear-end muscles when driving to try to help decrease use of her piriformis muscle.   Rehab Potential Good   Clinical Impairments Affecting Rehab Potential Chronicity of condition, age   PT Frequency 2x / week   PT Duration 6 weeks   PT Treatment/Interventions Therapeutic exercise;Manual techniques;Aquatic Therapy;Ultrasound;Advice worker;Therapeutic activities;Patient/family education;Neuromuscular re-education;Dry needling   PT Next Visit Plan femoral control, mechanics with stair negotiation, movement, hip strengthening, HEP   Consulted and Agree with Plan of Care Patient      Patient will benefit from skilled therapeutic intervention in order to improve the following deficits and impairments:  Pain, Improper body mechanics, Decreased strength  Visit Diagnosis: Chronic pain of right knee  Chronic pain of left knee  Difficulty in walking, not elsewhere classified  Muscle weakness (generalized)     Problem List There are no active problems to display for this patient.   Joneen Boers PT, DPT  09/20/2016, 7:12 PM  [place] [facility] [address], [place], [address] Phone: [phone]   Fax:  [phone]  Name: JEWELIANA DUDGEON MRN: [id number] Date of Birth: 1932/10/13

## 2016-09-25 ENCOUNTER — Ambulatory Visit: Payer: Medicare Other

## 2016-09-25 DIAGNOSIS — M6281 Muscle weakness (generalized): Secondary | ICD-10-CM

## 2016-09-25 DIAGNOSIS — M25562 Pain in left knee: Secondary | ICD-10-CM

## 2016-09-25 DIAGNOSIS — M25561 Pain in right knee: Principal | ICD-10-CM

## 2016-09-25 DIAGNOSIS — R262 Difficulty in walking, not elsewhere classified: Secondary | ICD-10-CM

## 2016-09-25 DIAGNOSIS — G8929 Other chronic pain: Secondary | ICD-10-CM

## 2016-09-25 NOTE — Therapy (Signed)
[place] [facility] [address] [place], [address] Phone: [phone]   Fax:  [phone]  Physical Therapy Treatment  Patient Details  Name: Andrea Hester MRN: [id number] Date of Birth: 29-May-1933 Referring Provider: Governor Specking III, MD  Encounter Date: 09/25/2016      PT End of Session - 09/25/16 1532    Visit Number 20   Number of Visits 43   Date for PT Re-Evaluation 10/12/16   Authorization Type 3   Authorization Time Period of 10   PT Start Time 1532   PT Stop Time 1619   PT Time Calculation (min) 47 min   Activity Tolerance Patient tolerated treatment well   Behavior During Therapy [facility] for tasks assessed/performed      Past Medical History:  Diagnosis Date  . Allergy   . GERD (gastroesophageal reflux disease)   . H/O Clostridium difficile infection   . History of palpitations   . Hyperlipidemia   . Migraines   . White coat hypertension     Past Surgical History:  Procedure Laterality Date  . GALLBLADDER SURGERY    . TONSILLECTOMY      There were no vitals filed for this visit.      Subjective Assessment - 09/25/16 1534    Subjective Thinks she might have over did it with chores yesterday and today. R LE bothering her a lot. R LE felt better after last session. 7/10 R LE currently. 4/10 L knee currently.    Pertinent History Bilateral knee pain. Pain began about 4 years ago when pt was going up and down the stairs. Used biofreeze which helped for about a year. 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 Pt states that her knees progressively worsened. Pt also has difficulty going up and down stairs to do laundry due to her knee pain.  Has not had any imaging for her knees.  Pt also states she might have pulled a muscle behind her R thigh from a balance exercise (standing and leaning back) that she did.    Patient Stated Goals Be  able to get out and get to church. Walk better. Pt adds that she wants to get rid of her cane.    Currently in Pain? Yes   Pain Score 7    Pain Onset More than a month ago                                 PT Education - 09/25/16 1831    Education provided Yes   Education Details ther-ex   [employer]) Educated Patient   Methods Explanation;Demonstration;Tactile cues;Verbal cues   Comprehension Returned demonstration;Verbalized understanding        Objectives  Manual therapy  STM to R piriformis muscle in L S/L              Decreased sciatic nerve pain to 4/10 after manual therapy.   Medial R patellar glide grade 3+             Decreased R knee pain   Decreased R LE pain to 4-5/10 after manual therapy  Gentle manual R hip distraction in supine.     There-ex    Directed patient with supine bilateral shoulder extension isometrics 10x3 with 5 second holds   Supine hip fallouts 5x each LE. Increased time secondary to  emphasis on proper movement.   Supine R single knee to chest 10x5 seconds   R poserior hip discomfort. Eases with rest  Supine SLR R hip flexion by PT. Limited to about 45 degrees due to R hip discomfort  Better able to perform after gentle manual R hip joint distraction. R posterior hip discomfort reproduced, eased with plantar flexion   Pt was recommended to continue working on her HEP and to listen to her body when doing chores (take rest breaks, sit, do sitting exercises, let symptoms calm down and start back up again). Pt demonstrated and verbalized understanding.    Improved exercise technique, movement at target joints, use of target muscles after mod verbal, visual, tactile cues.     Decreased R LE pain to 5/10 or less after session. Worked on manual therapy to decrease R piriformis muscle activation, improve R patellar mobility, and exercises to promote trunk muscle activation, lumbopelvic control, and gentle lumbar  flexion.                 PT Long Term Goals - 09/13/16 1442      PT LONG TERM GOAL #1   Title Patient will have a decrease in R knee pain to 3/10 or less at worst and L knee pain to 2/10 or less at worst to promote ability to stand up from a chair, ascend and descend stairs, and pick up items from the ground.    Baseline Pain at worst: R knee 7/10, L knee 5/10; 6/10 R knee pain at worst, 5/10 L knee pain at worst (06/29/2016 and 07/27/2016); L knee pain 2/10 at worst,  4.5/10  R knee pain at most for the past 7 days (not counting the morning stiffness) 08/29/2016.    For the past 7 days: 5.5/10 R knee, 4/10 L knee, (not counting the morning stiffness) and 6/10 R gastroc at most (09/13/2016)   Time 6   Period Weeks   Status Partially Met     PT LONG TERM GOAL #2   Title Patient will improve her LEFS score by at least 9 points as a demonstration of improved function.    Baseline 26/80; 29/80 (07/25/2016); 33/80 (08/29/2016)   Time 6   Period Weeks   Status On-going     PT LONG TERM GOAL #3   Title Patient will improve bilateral hip abduction and knee flexion strength by at least 1/2 MMT grade to promote ability to negotiate stairs and pick up items from the floor.    Time 6   Period Weeks   Status Achieved     PT LONG TERM GOAL #4   Title Patient will be able to ascend and descend 4 regular steps 3 times with bilateral UE assist with minimal to no complain of knee pain to promote ability to go up and down her stairs at home.    Baseline Increased bilateral knee pain with stair negotiation. Able to ascend and descend 4 regular steps with bilateral UE assist mod I with improved femoral control and without knee pain (after manual therapy). Able to perform today (after hip exercises) with minimal to no reports of knee pain with reciprocal gait pattern (08/29/2016)   Time 5   Period Weeks   Status Achieved     PT LONG TERM GOAL #5   Title Patient will be able to ambulate at  least 500 ft without LOB and without use of AD.    Baseline Pt currently ambulates with a SPC. Pt  able to ambulate up to 300 ft without SPC (CGA) and without LOB 09/13/2016.   Time 6   Period Weeks   Status On-going               Plan - 09/25/16 1531    Clinical Impression Statement Decreased R LE pain to 5/10 or less after session. Worked on manual therapy to decrease R piriformis muscle activation, improve R patellar mobility, and exercises to promote trunk muscle activation, lumbopelvic control, and gentle lumbar flexion.    Rehab Potential Good   Clinical Impairments Affecting Rehab Potential Chronicity of condition, age   PT Frequency 2x / week   PT Duration 6 weeks   PT Treatment/Interventions Therapeutic exercise;Manual techniques;Aquatic Therapy;Ultrasound;Advice worker;Therapeutic activities;Patient/family education;Neuromuscular re-education;Dry needling   PT Next Visit Plan femoral control, mechanics with stair negotiation, movement, hip strengthening, HEP   Consulted and Agree with Plan of Care Patient      Patient will benefit from skilled therapeutic intervention in order to improve the following deficits and impairments:  Pain, Improper body mechanics, Decreased strength  Visit Diagnosis: Chronic pain of right knee  Chronic pain of left knee  Difficulty in walking, not elsewhere classified  Muscle weakness (generalized)     Problem List There are no active problems to display for this patient.  Joneen Boers PT, DPT   09/25/2016, 6:43 PM  [place] [address] [place], [address] Phone: [phone]   Fax:  [phone]  Name: Andrea Hester MRN: [id number] Date of Birth: April 22, 1933

## 2016-09-27 ENCOUNTER — Ambulatory Visit: Payer: Medicare Other

## 2016-10-10 ENCOUNTER — Ambulatory Visit: Payer: Medicare Other

## 2016-10-12 ENCOUNTER — Ambulatory Visit: Payer: Medicare Other

## 2016-10-17 ENCOUNTER — Ambulatory Visit: Payer: Medicare Other

## 2016-10-19 ENCOUNTER — Ambulatory Visit: Payer: Medicare Other | Attending: Orthopedic Surgery

## 2016-10-19 DIAGNOSIS — M25561 Pain in right knee: Secondary | ICD-10-CM | POA: Insufficient documentation

## 2016-10-19 DIAGNOSIS — M5431 Sciatica, right side: Secondary | ICD-10-CM | POA: Insufficient documentation

## 2016-10-19 DIAGNOSIS — R262 Difficulty in walking, not elsewhere classified: Secondary | ICD-10-CM | POA: Insufficient documentation

## 2016-10-19 DIAGNOSIS — M6281 Muscle weakness (generalized): Secondary | ICD-10-CM | POA: Diagnosis present

## 2016-10-19 DIAGNOSIS — G8929 Other chronic pain: Secondary | ICD-10-CM | POA: Diagnosis present

## 2016-10-19 DIAGNOSIS — M25562 Pain in left knee: Secondary | ICD-10-CM | POA: Diagnosis present

## 2016-10-19 NOTE — Patient Instructions (Addendum)
(  Clinic) Hip: Internal Rotation - Sitting    Sitting on a chair, rotate your right foot out to the side, pain free range.  May hold table to keep body still. Repeat _10___ times per set. Do _3___ sets per session.   Copyright  [employer]. All rights reserved.      Sitting pillow squeeze: sitting on a chair, squeeze the pillow and hold for 5 - 10 seconds as you squeeze your rear end muscles as well.   Perform 10 times for 3 sets daily.

## 2016-10-19 NOTE — Therapy (Signed)
[place] [facility] [address] [place], [address] Phone: [phone]   Fax:  [phone]  Physical Therapy Treatment  Patient Details  Name: Andrea Hester MRN: [id number] Date of Birth: 1933/10/06 Referring Provider: Governor Specking III, MD  Encounter Date: 10/19/2016      PT End of Session - 10/19/16 1355    Visit Number 21   Number of Visits 55   Date for PT Re-Evaluation 11/30/16   Authorization Type 4   Authorization Time Period of 10   PT Start Time 1355   PT Stop Time 1430   PT Time Calculation (min) 35 min   Activity Tolerance Patient tolerated treatment well   Behavior During Therapy [facility] for tasks assessed/performed      Past Medical History:  Diagnosis Date  . Allergy   . GERD (gastroesophageal reflux disease)   . H/O Clostridium difficile infection   . History of palpitations   . Hyperlipidemia   . Migraines   . White coat hypertension     Past Surgical History:  Procedure Laterality Date  . GALLBLADDER SURGERY    . TONSILLECTOMY      There were no vitals filed for this visit.      Subjective Assessment - 10/19/16 1355    Subjective Had a bad cough and did not want to come in and get people sick. Has been staying around the house. Went to the store yesterday. Her sciatic nerve bothered when she got to the car. Placed a towel under her R thigh as suggested which helped her. Sciatic nerve usually acts up as soon as she gets into her car. Sciatic nerve pain increases when she lifts her R leg to get into the car.  The knees feels stiff.  The swelling in her R leg has also calmed down.  Pain at worst: 8/10 R sciatic nerve, 4-5/10 L knee pain, 6-7/10 R knee pain at most for the past 7 days. Up until 4 weeks ago, pt states that she was doing pretty good with PT until she got sick 4 weeks ago.    Pertinent History Bilateral knee pain. Pain began about 4 years ago when pt was going up and down  the stairs. Used biofreeze which helped for about a year. 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 Pt states that her knees progressively worsened. Pt also has difficulty going up and down stairs to do laundry due to her knee pain.  Has not had any imaging for her knees.  Pt also states she might have pulled a muscle behind her R thigh from a balance exercise (standing and leaning back) that she did.    Patient Stated Goals Be able to get out and get to church. Walk better. Pt adds that she wants to get rid of her cane.    Currently in Pain? Yes   Pain Score 6   6/10 R sciatic, and R knee, 4/10 L knee   Pain Onset More than a month ago            [facility] PT Assessment - 10/19/16 1954      Observation/Other Assessments   Lower Extremity Functional Scale  40/80                             PT Education - 10/19/16 1955    Education  provided Yes   Education Details ther-ex, HEP, plan of care   Person(s) Educated Patient   Methods Explanation;Demonstration;Tactile cues;Verbal cues;Handout   Comprehension Verbalized understanding;Returned demonstration     Objectives   There-ex  Directed patient with seated R hip IR 10x5  Reviewed and given as part of her HEP. Pt demonstrated and verbalized understanding.  Reviewed seated hip adduction pillow squeeze. Per pt request. Pt demonstrated and verbalized understanding.   Reviewed plan of care: 2x/week for 4- 6 weeks.    Improved exercise technique, movement at target joints, use of target muscles after min to mod verbal, visual, tactile cues.     Manual therapy   Medial glide R patella grade 3 in sitting with knee flexed.   STM to R piriformis in L S/L    8/10 R sciatic nerve at start of session per pt. Pain decreased to 6/10 after manual therapy.     Pt demonstrates improved LEFS score since last measured, suggesting improved function. Still  demonstrates bilateral knee pain R > L secondary to stiffness. Manual therapy to patellae helps decrease pain. Main barrier to decreasing knee pain includes her sciatic symptoms in her R LE. Decreasing R piriformis muscle tension, increasing glute max use, and decreasing lumbar extension pressure helps decrease symptoms. Pt will benefit from continued skilled physical therapy services to promote glute max strength, core strengthening, lumbopelvic control, femoral control, patellar mobility, and improve ability to perform functional tasks.       PT Long Term Goals - 10/19/16 1957      PT LONG TERM GOAL #1   Title Patient will have a decrease in R knee pain to 3/10 or less at worst and L knee pain to 2/10 or less at worst to promote ability to stand up from a chair, ascend and descend stairs, and pick up items from the ground.    Baseline Pain at worst: R knee 7/10, L knee 5/10; 6/10 R knee pain at worst, 5/10 L knee pain at worst (06/29/2016 and 07/27/2016); L knee pain 2/10 at worst,  4.5/10  R knee pain at most for the past 7 days (not counting the morning stiffness) 08/29/2016.    For the past 7 days: 5.5/10 R knee, 4/10 L knee, (not counting the morning stiffness) and 6/10 R gastroc at most (09/13/2016);  For the past 7 days: 6-7/10 R knee, 4-5/10 L knee, 8/10 R gastroc/sciatic pain at most (10/19/2016)   Time 6   Period Weeks   Status On-going     PT LONG TERM GOAL #2   Title Patient will improve her LEFS score by at least 9 points as a demonstration of improved function.    Baseline 26/80; 29/80 (07/25/2016); 33/80 (08/29/2016); 40/80 (10/19/2016)   Time 6   Period Weeks   Status Achieved     PT LONG TERM GOAL #3   Title Patient will improve bilateral hip abduction and knee flexion strength by at least 1/2 MMT grade to promote ability to negotiate stairs and pick up items from the floor.    Time 6   Period Weeks   Status Achieved     PT LONG TERM GOAL #4   Title Patient will be able to  ascend and descend 4 regular steps 3 times with bilateral UE assist with minimal to no complain of knee pain to promote ability to go up and down her stairs at home.    Baseline Increased bilateral knee pain with stair negotiation. Able to ascend  and descend 4 regular steps with bilateral UE assist mod I with improved femoral control and without knee pain (after manual therapy). Able to perform today (after hip exercises) with minimal to no reports of knee pain with reciprocal gait pattern (08/29/2016)   Time 5   Period Weeks   Status Achieved     PT LONG TERM GOAL #5   Title Patient will be able to ambulate at least 500 ft without LOB and without use of AD.    Baseline Pt currently ambulates with a SPC. Pt able to ambulate up to 300 ft without SPC (CGA) and without LOB 09/13/2016.   Time 6   Period Weeks   Status On-going               Plan - 10/19/16 1956    Clinical Impression Statement  Pt demonstrates improved LEFS score since last measured, suggesting improved function. Still demonstrates bilateral knee pain R > L secondary to stiffness. Manual therapy to patellae helps decrease pain. Main barrier to decreasing knee pain includes her sciatic symptoms in her R LE. Decreasing R piriformis muscle tension, increasing glute max use, and decreasing lumbar extension pressure helps decrease symptoms. Pt will benefit from continued skilled physical therapy services to promote glute max strength, core strengthening, lumbopelvic control, femoral control, patellar mobility, and improve ability to perform functional tasks.    Rehab Potential Good   Clinical Impairments Affecting Rehab Potential Chronicity of condition, age   PT Frequency 2x / week   PT Duration 6 weeks   PT Treatment/Interventions Therapeutic exercise;Manual techniques;Aquatic Therapy;Ultrasound;Advice worker;Therapeutic activities;Patient/family education;Neuromuscular re-education;Dry needling   PT Next  Visit Plan femoral control, mechanics with stair negotiation, movement, hip strengthening, HEP   Consulted and Agree with Plan of Care Patient      Patient will benefit from skilled therapeutic intervention in order to improve the following deficits and impairments:  Pain, Improper body mechanics, Decreased strength  Visit Diagnosis: Chronic pain of right knee - Plan: PT plan of care cert/re-cert  Chronic pain of left knee - Plan: PT plan of care cert/re-cert  Difficulty in walking, not elsewhere classified - Plan: PT plan of care cert/re-cert  Muscle weakness (generalized) - Plan: PT plan of care cert/re-cert  Sciatica, right side - Plan: PT plan of care cert/re-cert     Problem List There are no active problems to display for this patient.   Joneen Boers PT, DPT   10/19/2016, 8:20 PM  [place] [facility] [address], [place], [address] Phone: [phone]   Fax:  [phone]  Name: DOTSY KIMPEL MRN: [id number] Date of Birth: 1933-07-10

## 2016-10-24 ENCOUNTER — Ambulatory Visit: Payer: Medicare Other

## 2016-10-24 DIAGNOSIS — M5431 Sciatica, right side: Secondary | ICD-10-CM

## 2016-10-24 DIAGNOSIS — M6281 Muscle weakness (generalized): Secondary | ICD-10-CM

## 2016-10-24 DIAGNOSIS — G8929 Other chronic pain: Secondary | ICD-10-CM

## 2016-10-24 DIAGNOSIS — M25561 Pain in right knee: Secondary | ICD-10-CM

## 2016-10-24 DIAGNOSIS — R262 Difficulty in walking, not elsewhere classified: Secondary | ICD-10-CM

## 2016-10-24 DIAGNOSIS — M25562 Pain in left knee: Secondary | ICD-10-CM

## 2016-10-24 NOTE — Patient Instructions (Addendum)
Pt education on self massaging R lateral thigh (vastus lateralis) following use of hot pack x 15 prior.  Also gave seated bilateral shoulder extension resisting yellow band 10x3 with 5 second holds to promote trunk strengthening. Can be used instead of her standing shoulder extension exercise.   Pt demonstrated and verbalized understanding.

## 2016-10-24 NOTE — Therapy (Signed)
[facility] [address], [place], [address] Phone: [phone]   Fax:  [phone]  Physical Therapy Treatment  Patient Details  Name: Andrea Hester MRN: [id number] Date of Birth: Oct 03, 1933 Referring Provider: Governor Specking III, MD  Encounter Date: 10/24/2016      PT End of Session - 10/24/16 [id number]    Visit Number 22   Number of Visits 55   Date for PT Re-Evaluation 11/30/16   Authorization Type 5   Authorization Time Period of 10   PT Start Time 1437  pt arrived late   PT Stop Time 1527   PT Time Calculation (min) 50 min   Activity Tolerance Patient tolerated treatment well   Behavior During Therapy [facility] for tasks assessed/performed      Past Medical History:  Diagnosis Date  . Allergy   . GERD (gastroesophageal reflux disease)   . H/O Clostridium difficile infection   . History of palpitations   . Hyperlipidemia   . Migraines   . White coat hypertension     Past Surgical History:  Procedure Laterality Date  . GALLBLADDER SURGERY    . TONSILLECTOMY      There were no vitals filed for this visit.      Subjective Assessment - 10/24/16 1439    Subjective R sciatic pain is 5-6/10, R knee 6/10, 2-3/10 L knee currently.    Pertinent History Bilateral knee pain. Pain began about 4 years ago when pt was going up and down the stairs. Used biofreeze which helped for about a year. 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 Pt states that her knees progressively worsened. Pt also has difficulty going up and down stairs to do laundry due to her knee pain.  Has not had any imaging for her knees.  Pt also states she might have pulled a muscle behind her R thigh from a balance exercise (standing and leaning back) that she did.    Patient Stated Goals Be able to get out and get to church. Walk better. Pt adds that she wants to get rid of her  cane.    Currently in Pain? Yes   Pain Score 6    Pain Onset More than a month ago                                 PT Education - 10/24/16 1500    Education provided Yes   Education Details ther-ex, HEP   Person(s) Educated Patient   Methods Explanation;Demonstration;Verbal cues;Tactile cues   Comprehension Verbalized understanding;Returned demonstration         Objectives  Manual therapy   Seated medial glide to R patella grade 3+  Decreased R knee pain to 5/10 STM R lateral thigh  Decreased R knee pain to 4/10   There-ex  Seated R knee extension 10x2 for neural flossing. Decreased sciatic symptoms Seated ankle heel toes 10x3 each way for neural flossing. Seated bilateral shoulder extension resisting yellow band 10x5 seconds for 3 sets  Decreased R sciatic symptoms. Reviewed and given as part of her HEP. Pt demonstrated and verbalized understanding.   Sit <> stand using R LE more with TRX strap assist 5x2  NuStep seat 6, arms 7 for 5 min (3 min skilled) at level 1. Cues for glute max squeeze as she pushes with her  R foot, mimicking pressing on the gas pedal of her car  Decreased R sciatic symptoms with activation of R glute max muscle when pushing with R foot.     Improved exercise technique, movement at target joints, use of target muscles after mod verbal, visual, tactile cues.     Decreased R knee pain following medial glide to R patella in addition to STM to R vastus lateralis. Slight decreased R sciatic symptoms with R LE neural flossing. Decreased R sciatic symptoms with exercises to promote trunk activation and use of R glute max muscles.         PT Long Term Goals - 10/19/16 1957      PT LONG TERM GOAL #1   Title Patient will have a decrease in R knee pain to 3/10 or less at worst and L knee pain to 2/10 or less at worst to promote ability to stand up from a chair, ascend and descend stairs, and pick up items from the ground.     Baseline Pain at worst: R knee 7/10, L knee 5/10; 6/10 R knee pain at worst, 5/10 L knee pain at worst (06/29/2016 and 07/27/2016); L knee pain 2/10 at worst,  4.5/10  R knee pain at most for the past 7 days (not counting the morning stiffness) 08/29/2016.    For the past 7 days: 5.5/10 R knee, 4/10 L knee, (not counting the morning stiffness) and 6/10 R gastroc at most (09/13/2016);  For the past 7 days: 6-7/10 R knee, 4-5/10 L knee, 8/10 R gastroc/sciatic pain at most (10/19/2016)   Time 6   Period Weeks   Status On-going     PT LONG TERM GOAL #2   Title Patient will improve her LEFS score by at least 9 points as a demonstration of improved function.    Baseline 26/80; 29/80 (07/25/2016); 33/80 (08/29/2016); 40/80 (10/19/2016)   Time 6   Period Weeks   Status Achieved     PT LONG TERM GOAL #3   Title Patient will improve bilateral hip abduction and knee flexion strength by at least 1/2 MMT grade to promote ability to negotiate stairs and pick up items from the floor.    Time 6   Period Weeks   Status Achieved     PT LONG TERM GOAL #4   Title Patient will be able to ascend and descend 4 regular steps 3 times with bilateral UE assist with minimal to no complain of knee pain to promote ability to go up and down her stairs at home.    Baseline Increased bilateral knee pain with stair negotiation. Able to ascend and descend 4 regular steps with bilateral UE assist mod I with improved femoral control and without knee pain (after manual therapy). Able to perform today (after hip exercises) with minimal to no reports of knee pain with reciprocal gait pattern (08/29/2016)   Time 5   Period Weeks   Status Achieved     PT LONG TERM GOAL #5   Title Patient will be able to ambulate at least 500 ft without LOB and without use of AD.    Baseline Pt currently ambulates with a SPC. Pt able to ambulate up to 300 ft without SPC (CGA) and without LOB 09/13/2016.   Time 6   Period Weeks   Status On-going                Plan - 10/24/16 1501    Clinical Impression Statement Decreased R knee pain  following medial glide to R patella in addition to STM to R vastus lateralis. Slight decreased R sciatic symptoms with R LE neural flossing. Decreased R sciatic symptoms with exercises to promote trunk activation and use of R glute max muscles.    Rehab Potential Good   Clinical Impairments Affecting Rehab Potential Chronicity of condition, age   PT Frequency 2x / week   PT Duration 6 weeks   PT Treatment/Interventions Therapeutic exercise;Manual techniques;Aquatic Therapy;Ultrasound;Advice worker;Therapeutic activities;Patient/family education;Neuromuscular re-education;Dry needling   PT Next Visit Plan femoral control, mechanics with stair negotiation, movement, hip strengthening, HEP   Consulted and Agree with Plan of Care Patient      Patient will benefit from skilled therapeutic intervention in order to improve the following deficits and impairments:  Pain, Improper body mechanics, Decreased strength  Visit Diagnosis: Sciatica, right side  Chronic pain of left knee  Chronic pain of right knee  Difficulty in walking, not elsewhere classified  Muscle weakness (generalized)     Problem List There are no active problems to display for this patient.   Joneen Boers PT, DPT   10/24/2016, 3:38 PM  [facility] [address] [place], [address] Phone: [phone]   Fax:  [phone]  Name: Andrea Hester MRN: [id number] Date of Birth: 02-04-1933

## 2016-10-30 ENCOUNTER — Ambulatory Visit: Payer: Medicare Other

## 2016-10-30 DIAGNOSIS — M6281 Muscle weakness (generalized): Secondary | ICD-10-CM

## 2016-10-30 DIAGNOSIS — M5431 Sciatica, right side: Secondary | ICD-10-CM

## 2016-10-30 DIAGNOSIS — M25561 Pain in right knee: Secondary | ICD-10-CM | POA: Diagnosis not present

## 2016-10-30 DIAGNOSIS — M25562 Pain in left knee: Secondary | ICD-10-CM

## 2016-10-30 DIAGNOSIS — G8929 Other chronic pain: Secondary | ICD-10-CM

## 2016-10-30 DIAGNOSIS — R262 Difficulty in walking, not elsewhere classified: Secondary | ICD-10-CM

## 2016-10-30 NOTE — Therapy (Signed)
[facility] [address] [place], [address] Phone: [phone]   Fax:  [phone]  Physical Therapy Treatment  Patient Details  Name: Andrea Hester MRN: [id number] Date of Birth: 03-12-1933 Referring Provider: Governor Specking III, MD  Encounter Date: 10/30/2016      PT End of Session - 10/30/16 1531    Visit Number 23   Number of Visits 55   Date for PT Re-Evaluation 11/30/16   Authorization Type 6   Authorization Time Period of 10   PT Start Time 1531   PT Stop Time 1616   PT Time Calculation (min) 45 min   Activity Tolerance Patient tolerated treatment well   Behavior During Therapy [facility] for tasks assessed/performed      Past Medical History:  Diagnosis Date  . Allergy   . GERD (gastroesophageal reflux disease)   . H/O Clostridium difficile infection   . History of palpitations   . Hyperlipidemia   . Migraines   . White coat hypertension     Past Surgical History:  Procedure Laterality Date  . GALLBLADDER SURGERY    . TONSILLECTOMY      There were no vitals filed for this visit.      Subjective Assessment - 10/30/16 1533    Subjective 2/10 L knee, 3-4/10 R knee joint currently. 5-6/10 R sciatic symptoms (from posterior hip, thigh, to back of knee). Was able to drive for 18 min which is the longest, usually averaging 10 min.    Pertinent History Bilateral knee pain. Pain began about 4 years ago when pt was going up and down the stairs. Used biofreeze which helped for about a year. 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 Pt states that her knees progressively worsened. Pt also has difficulty going up and down stairs to do laundry due to her knee pain.  Has not had any imaging for her knees.  Pt also states she might have pulled a muscle behind her R thigh from a balance exercise (standing and leaning back) that she did.    Patient Stated Goals Be able to get out and get to church. Walk better. Pt adds that she wants to get rid of her cane.    Currently in Pain? Yes   Pain Score 6   5-6/10 R sciatic symptoms.    Pain Onset More than a month ago                                 PT Education - 10/30/16 1609    Education provided Yes   Education Details ther-ex   [employer]) Educated Patient   Methods Explanation;Demonstration;Tactile cues;Verbal cues   Comprehension Verbalized understanding;Returned demonstration        Objecgtives  Manual therapy  STM R vastus lateralis with pt education on how to perform at home. Pt demonstrated and verbalized understanding.   Decreased R knee pain Medial glide R patella in sitting, grade 3  Decreased knee pain and R sciatic symptoms  STM to R piriformis muscle in L S/L with slight stretch  STM to R TFL muscle in L S/L  No R knee or sciatic pain after manual therapy    There-ex  Supine R piriformis stretch 20 seconds x 4  Increased R gastroc symptoms. Eased with STM R piriformis Supine R single knee to  chest 20 seconds. R gastroc symptoms. Eased with STM to R piriformis  PROM R knee flexion 10x3 by PT in sitting.   Pt was recommended to keep her R foot to the R side a little more to not ER her R hip as much when she presses on the gas pedal when driving. Pt verbalized understanding.   Improved exercise technique, movement at target joints, use of target muscles after min to  mod verbal, visual, tactile cues.    Reproduction of R gastroc symptoms with pressure to R piriformis muscle. R sciatic symptoms decreased following STM to R piriformis muscle as well as following medial glide to R patella in sitting. Decreased R knee joint pain following STM to R vastus lateralis and medial glide to R patella.        PT Long Term Goals - 10/19/16 1957      PT LONG TERM GOAL #1   Title Patient will have a decrease in R knee pain to 3/10 or  less at worst and L knee pain to 2/10 or less at worst to promote ability to stand up from a chair, ascend and descend stairs, and pick up items from the ground.    Baseline Pain at worst: R knee 7/10, L knee 5/10; 6/10 R knee pain at worst, 5/10 L knee pain at worst (06/29/2016 and 07/27/2016); L knee pain 2/10 at worst,  4.5/10  R knee pain at most for the past 7 days (not counting the morning stiffness) 08/29/2016.    For the past 7 days: 5.5/10 R knee, 4/10 L knee, (not counting the morning stiffness) and 6/10 R gastroc at most (09/13/2016);  For the past 7 days: 6-7/10 R knee, 4-5/10 L knee, 8/10 R gastroc/sciatic pain at most (10/19/2016)   Time 6   Period Weeks   Status On-going     PT LONG TERM GOAL #2   Title Patient will improve her LEFS score by at least 9 points as a demonstration of improved function.    Baseline 26/80; 29/80 (07/25/2016); 33/80 (08/29/2016); 40/80 (10/19/2016)   Time 6   Period Weeks   Status Achieved     PT LONG TERM GOAL #3   Title Patient will improve bilateral hip abduction and knee flexion strength by at least 1/2 MMT grade to promote ability to negotiate stairs and pick up items from the floor.    Time 6   Period Weeks   Status Achieved     PT LONG TERM GOAL #4   Title Patient will be able to ascend and descend 4 regular steps 3 times with bilateral UE assist with minimal to no complain of knee pain to promote ability to go up and down her stairs at home.    Baseline Increased bilateral knee pain with stair negotiation. Able to ascend and descend 4 regular steps with bilateral UE assist mod I with improved femoral control and without knee pain (after manual therapy). Able to perform today (after hip exercises) with minimal to no reports of knee pain with reciprocal gait pattern (08/29/2016)   Time 5   Period Weeks   Status Achieved     PT LONG TERM GOAL #5   Title Patient will be able to ambulate at least 500 ft without LOB and without use of AD.     Baseline Pt currently ambulates with a SPC. Pt able to ambulate up to 300 ft without SPC (CGA) and without LOB 09/13/2016.   Time 6  Period Weeks   Status On-going               Plan - 10/30/16 1937    Clinical Impression Statement Reproduction of R gastroc symptoms with pressure to R piriformis muscle. R sciatic symptoms decreased following STM to R piriformis muscle as well as following medial glide to R patella in sitting. Decreased R knee joint pain following STM to R vastus lateralis and medial glide to R patella.    Rehab Potential Good   Clinical Impairments Affecting Rehab Potential Chronicity of condition, age   PT Frequency 2x / week   PT Duration 6 weeks   PT Treatment/Interventions Therapeutic exercise;Manual techniques;Aquatic Therapy;Ultrasound;Advice worker;Therapeutic activities;Patient/family education;Neuromuscular re-education;Dry needling   PT Next Visit Plan femoral control, mechanics with stair negotiation, movement, hip strengthening, HEP   Consulted and Agree with Plan of Care Patient      Patient will benefit from skilled therapeutic intervention in order to improve the following deficits and impairments:  Pain, Improper body mechanics, Decreased strength  Visit Diagnosis: Sciatica, right side  Chronic pain of left knee  Chronic pain of right knee  Difficulty in walking, not elsewhere classified  Muscle weakness (generalized)     Problem List There are no active problems to display for this patient.   Joneen Boers PT, DPT   10/30/2016, 7:44 PM  [facility] [address] [place], [address] Phone: [phone]   Fax:  [phone]  Name: LATORYA SWOFFORD MRN: [id number] Date of Birth: 1933/06/16

## 2016-11-01 ENCOUNTER — Ambulatory Visit: Payer: Medicare Other

## 2016-11-01 DIAGNOSIS — M6281 Muscle weakness (generalized): Secondary | ICD-10-CM

## 2016-11-01 DIAGNOSIS — M25561 Pain in right knee: Secondary | ICD-10-CM

## 2016-11-01 DIAGNOSIS — M5431 Sciatica, right side: Secondary | ICD-10-CM

## 2016-11-01 DIAGNOSIS — G8929 Other chronic pain: Secondary | ICD-10-CM

## 2016-11-01 DIAGNOSIS — R262 Difficulty in walking, not elsewhere classified: Secondary | ICD-10-CM

## 2016-11-01 NOTE — Therapy (Signed)
[facility] [place] [facility] [address] [place], [address] Phone: [phone]   Fax:  [phone]  Physical Therapy Treatment  Patient Details  Name: Andrea Hester MRN: [id number] Date of Birth: 01-09-1933 Referring Provider: Governor Specking III, MD  Encounter Date: 11/01/2016      PT End of Session - 11/01/16 [id number]    Visit Number 24   Number of Visits 55   Date for PT Re-Evaluation 11/30/16   Authorization Type 7   Authorization Time Period of 10   PT Start Time 1347   PT Stop Time 1433   PT Time Calculation (min) 46 min   Activity Tolerance Patient tolerated treatment well   Behavior During Therapy [facility] for tasks assessed/performed      Past Medical History:  Diagnosis Date  . Allergy   . GERD (gastroesophageal reflux disease)   . H/O Clostridium difficile infection   . History of palpitations   . Hyperlipidemia   . Migraines   . White coat hypertension     Past Surgical History:  Procedure Laterality Date  . GALLBLADDER SURGERY    . TONSILLECTOMY      There were no vitals filed for this visit.      Subjective Assessment - 11/01/16 1349    Subjective Pt states that the treatment last time felt really good until today. No R sciatic symptoms (posterior R hip, thigh). Just at the calf. The R knee is stiff but not as stiff.  4.5/10 R knee, 2/10 R sciatic pain, 2/10 L knee pain currently.    Pertinent History Bilateral knee pain. Pain began about 4 years ago when pt was going up and down the stairs. Used biofreeze which helped for about a year. 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 Pt states that her knees progressively worsened. Pt also has difficulty going up and down stairs to do laundry due to her knee pain.  Has not had any imaging for her knees.  Pt also states she might have pulled a muscle behind her R thigh from a balance exercise  (standing and leaning back) that she did.    Patient Stated Goals Be able to get out and get to church. Walk better. Pt adds that she wants to get rid of her cane.    Currently in Pain? Yes   Pain Score 5   4.5/10 R knee pain   Pain Onset More than a month ago            [facility] PT Assessment - 11/01/16 1413      Observation/Other Assessments   Observations (-) long sit test                             PT Education - 11/01/16 1438    Education provided Yes   Education Details ther-ex, gait; piriformis and sciatic pain; vastus lateralis muscle, TFL muscle, iliotibial band,  patella and knee pain    Person(s) Educated Patient   Methods Explanation;Demonstration;Verbal cues   Comprehension Verbalized understanding;Returned demonstration        Objectives  Manual therapy  STM R vastus lateralis in sitting    Medial glide R patella in sitting, grade 3 to 3+              STM to R piriformis muscle in L S/L with slight stretch  STM to R TFL muscle in L S/L  STM to R iliotibial band in L S/L     No R knee or sciatic pain after manual therapy    There-ex  PROM R knee flexion 10x3 by PT in sitting.   Gait around gym 300 ft without AD, cues for increasing hip and knee flexion and step length as well as use of glute max muscle. No LOB. CGA to SBA.  Slight increased R knee pain with increased gait which decreased with activation of glute max muscle.   Improved exercise technique, movement at target joints, use of target muscles after min to  mod verbal, visual, tactile cues.     Decreased R knee pain following manual therapy to improve R patellar, and iliotibial band mobility, STM to R vastus lateralis and R TFL muscle area. Decreased R sciatic symptoms with STM to decrease R piriformis muscle tension. Able to ambulate 300 ft without AD with CGA to SBA. Cues needed for increasing knee flexion and step length bilaterally. Slight increase  in R knee symptoms with increased gait distance. Decreased R knee symptoms during gait with activation of glute max muscles. No R knee or R sciatic symptoms after today's session.          PT Long Term Goals - 10/19/16 1957      PT LONG TERM GOAL #1   Title Patient will have a decrease in R knee pain to 3/10 or less at worst and L knee pain to 2/10 or less at worst to promote ability to stand up from a chair, ascend and descend stairs, and pick up items from the ground.    Baseline Pain at worst: R knee 7/10, L knee 5/10; 6/10 R knee pain at worst, 5/10 L knee pain at worst (06/29/2016 and 07/27/2016); L knee pain 2/10 at worst,  4.5/10  R knee pain at most for the past 7 days (not counting the morning stiffness) 08/29/2016.    For the past 7 days: 5.5/10 R knee, 4/10 L knee, (not counting the morning stiffness) and 6/10 R gastroc at most (09/13/2016);  For the past 7 days: 6-7/10 R knee, 4-5/10 L knee, 8/10 R gastroc/sciatic pain at most (10/19/2016)   Time 6   Period Weeks   Status On-going     PT LONG TERM GOAL #2   Title Patient will improve her LEFS score by at least 9 points as a demonstration of improved function.    Baseline 26/80; 29/80 (07/25/2016); 33/80 (08/29/2016); 40/80 (10/19/2016)   Time 6   Period Weeks   Status Achieved     PT LONG TERM GOAL #3   Title Patient will improve bilateral hip abduction and knee flexion strength by at least 1/2 MMT grade to promote ability to negotiate stairs and pick up items from the floor.    Time 6   Period Weeks   Status Achieved     PT LONG TERM GOAL #4   Title Patient will be able to ascend and descend 4 regular steps 3 times with bilateral UE assist with minimal to no complain of knee pain to promote ability to go up and down her stairs at home.    Baseline Increased bilateral knee pain with stair negotiation. Able to ascend and descend 4 regular steps with bilateral UE assist mod I with improved femoral control and without knee pain  (after manual therapy). Able to perform today (after hip exercises) with minimal to no reports of knee  pain with reciprocal gait pattern (08/29/2016)   Time 5   Period Weeks   Status Achieved     PT LONG TERM GOAL #5   Title Patient will be able to ambulate at least 500 ft without LOB and without use of AD.    Baseline Pt currently ambulates with a SPC. Pt able to ambulate up to 300 ft without SPC (CGA) and without LOB 09/13/2016.   Time 6   Period Weeks   Status On-going               Plan - 11/01/16 1440    Clinical Impression Statement Decreased R knee pain following manual therapy to improve R patellar, and iliotibial band mobility, STM to R vastus lateralis and R TFL muscle area. Decreased R sciatic symptoms with STM to decrease R piriformis muscle tension. Able to ambulate 300 ft without AD with CGA to SBA. Cues needed for increasing knee flexion and step length bilaterally. Slight increase in R knee symptoms with increased gait distance. Decreased R knee symptoms during gait with activation of glute max muscles. No R knee or R sciatic symptoms after today's session.      Rehab Potential Good   Clinical Impairments Affecting Rehab Potential Chronicity of condition, age   PT Frequency 2x / week   PT Duration 6 weeks   PT Treatment/Interventions Therapeutic exercise;Manual techniques;Aquatic Therapy;Ultrasound;Advice worker;Therapeutic activities;Patient/family education;Neuromuscular re-education;Dry needling   PT Next Visit Plan femoral control, mechanics with stair negotiation, movement, hip strengthening, HEP   Consulted and Agree with Plan of Care Patient      Patient will benefit from skilled therapeutic intervention in order to improve the following deficits and impairments:  Pain, Improper body mechanics, Decreased strength  Visit Diagnosis: Sciatica, right side  Chronic pain of right knee  Difficulty in walking, not elsewhere  classified  Muscle weakness (generalized)     Problem List There are no active problems to display for this patient.   Joneen Boers PT, DPT   11/01/2016, 2:45 PM  [facility] [address], [place], [address] Phone: [phone]   Fax:  [phone]  Name: Andrea Hester MRN: [id number] Date of Birth: 04/28/1933

## 2016-11-06 ENCOUNTER — Ambulatory Visit: Payer: Medicare Other

## 2016-11-06 DIAGNOSIS — G8929 Other chronic pain: Secondary | ICD-10-CM

## 2016-11-06 DIAGNOSIS — M6281 Muscle weakness (generalized): Secondary | ICD-10-CM

## 2016-11-06 DIAGNOSIS — M25561 Pain in right knee: Principal | ICD-10-CM

## 2016-11-06 DIAGNOSIS — M25562 Pain in left knee: Secondary | ICD-10-CM

## 2016-11-06 DIAGNOSIS — R262 Difficulty in walking, not elsewhere classified: Secondary | ICD-10-CM

## 2016-11-06 DIAGNOSIS — M5431 Sciatica, right side: Secondary | ICD-10-CM

## 2016-11-06 NOTE — Therapy (Signed)
[facility] [address] [place], [address] Phone: [phone]   Fax:  [phone]  Physical Therapy Treatment  Patient Details  Name: Andrea Hester MRN: [id number] Date of Birth: 04/29/1933 Referring Provider: Governor Specking III, MD  Encounter Date: 11/06/2016      PT End of Session - 11/06/16 1122    Visit Number 25   Number of Visits 55   Date for PT Re-Evaluation 11/30/16   Authorization Type 8   Authorization Time Period of 10   PT Start Time 1122   PT Stop Time 1206   PT Time Calculation (min) 44 min   Activity Tolerance Patient tolerated treatment well   Behavior During Therapy [facility] for tasks assessed/performed      Past Medical History:  Diagnosis Date  . Allergy   . GERD (gastroesophageal reflux disease)   . H/O Clostridium difficile infection   . History of palpitations   . Hyperlipidemia   . Migraines   . White coat hypertension     Past Surgical History:  Procedure Laterality Date  . GALLBLADDER SURGERY    . TONSILLECTOMY      There were no vitals filed for this visit.      Subjective Assessment - 11/06/16 1123    Subjective Pt states that she feels sciatic pain at the back of her R hip, thigh knee and calf (4-5/10). I think the sciatic nerve seems to be feeling better. Went shopping after last session. I think shopping gets me. It seems aroung 3-4 pm, the knees feel better.  5-6/10 R knee pain, 3-4/ L knee currently (it's low).   Pertinent History Bilateral knee pain. Pain began about 4 years ago when pt was going up and down the stairs. Used biofreeze which helped for about a year. 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 Pt states that her knees progressively worsened. Pt also has difficulty going up and down stairs to do laundry due to her knee pain.  Has not had any imaging for her knees.  Pt also states she might  have pulled a muscle behind her R thigh from a balance exercise (standing and leaning back) that she did.    Patient Stated Goals Be able to get out and get to church. Walk better. Pt adds that she wants to get rid of her cane.    Currently in Pain? Yes   Pain Score 6   4-5/10 R sciatic pain, 5-6/10 R knee pain   Pain Onset More than a month ago                                 PT Education - 11/06/16 1216    Education provided Yes   Education Details ther-ex, HEP   Person(s) Educated Patient   Methods Explanation;Demonstration;Tactile cues;Verbal cues   Comprehension Verbalized understanding;Returned demonstration        Objectives   Manual therapy  Supine with knee flexed  Medial tibial rotation grade 3  A to P to Tibia grade 3  Medial, superior and inferior glide to R patella grade 3 to 3+  Seated with R knee flexed  A to P to tibia with IR grade 3+    Decreased stiffness    There-ex  Directed pt with seated R knee flexion AAROM using ball 10x3  Pt  education to perform exercise at least 3x10 daily. Pt demonstrated and verbalized understanding.   Standing R knee flexion AROM with bilateral UE assist 4x. Posterior proximal gastroc discomfort which eased with rest  Standing R knee flexion AAROM with R foot on first step 10x with bilateral UE assist. Slight posterior R thigh discomfort which eases with rest.     Improved exercise technique, movement at target joints, use of target muscles after min to mod verbal, visual, tactile cues.   Decreased R knee pain to 3.5/10 and 0/10 R sciatic pain in sitting after session. Pt education on performing seated R knee flexion AAROM using ball at home to help maintain ROM and mobility gained from today's session. Pt demonstrates stiffness dominant symptoms for her R knee. Continue with manual therapy to help promote and maintain mobility and decrease pain.            PT Long Term Goals - 10/19/16  1957      PT LONG TERM GOAL #1   Title Patient will have a decrease in R knee pain to 3/10 or less at worst and L knee pain to 2/10 or less at worst to promote ability to stand up from a chair, ascend and descend stairs, and pick up items from the ground.    Baseline Pain at worst: R knee 7/10, L knee 5/10; 6/10 R knee pain at worst, 5/10 L knee pain at worst (06/29/2016 and 07/27/2016); L knee pain 2/10 at worst,  4.5/10  R knee pain at most for the past 7 days (not counting the morning stiffness) 08/29/2016.    For the past 7 days: 5.5/10 R knee, 4/10 L knee, (not counting the morning stiffness) and 6/10 R gastroc at most (09/13/2016);  For the past 7 days: 6-7/10 R knee, 4-5/10 L knee, 8/10 R gastroc/sciatic pain at most (10/19/2016)   Time 6   Period Weeks   Status On-going     PT LONG TERM GOAL #2   Title Patient will improve her LEFS score by at least 9 points as a demonstration of improved function.    Baseline 26/80; 29/80 (07/25/2016); 33/80 (08/29/2016); 40/80 (10/19/2016)   Time 6   Period Weeks   Status Achieved     PT LONG TERM GOAL #3   Title Patient will improve bilateral hip abduction and knee flexion strength by at least 1/2 MMT grade to promote ability to negotiate stairs and pick up items from the floor.    Time 6   Period Weeks   Status Achieved     PT LONG TERM GOAL #4   Title Patient will be able to ascend and descend 4 regular steps 3 times with bilateral UE assist with minimal to no complain of knee pain to promote ability to go up and down her stairs at home.    Baseline Increased bilateral knee pain with stair negotiation. Able to ascend and descend 4 regular steps with bilateral UE assist mod I with improved femoral control and without knee pain (after manual therapy). Able to perform today (after hip exercises) with minimal to no reports of knee pain with reciprocal gait pattern (08/29/2016)   Time 5   Period Weeks   Status Achieved     PT LONG TERM GOAL #5    Title Patient will be able to ambulate at least 500 ft without LOB and without use of AD.    Baseline Pt currently ambulates with a SPC. Pt able to ambulate up to  300 ft without SPC (CGA) and without LOB 09/13/2016.   Time 6   Period Weeks   Status On-going               Plan - 11/06/16 1217    Clinical Impression Statement Decreased R knee pain to 3.5/10 and 0/10 R sciatic pain in sitting after session. Pt education on performing seated R knee flexion AAROM using ball at home to help maintain ROM and mobility gained from today's session. Pt demonstrates stiffness dominant symptoms for her R knee. Continue with manual therapy to help promote and maintain mobility and decrease pain.    Rehab Potential Good   Clinical Impairments Affecting Rehab Potential Chronicity of condition, age   PT Frequency 2x / week   PT Duration 6 weeks   PT Treatment/Interventions Therapeutic exercise;Manual techniques;Aquatic Therapy;Ultrasound;Advice worker;Therapeutic activities;Patient/family education;Neuromuscular re-education;Dry needling   PT Next Visit Plan femoral control, mechanics with stair negotiation, movement, hip strengthening, HEP   Consulted and Agree with Plan of Care Patient      Patient will benefit from skilled therapeutic intervention in order to improve the following deficits and impairments:  Pain, Improper body mechanics, Decreased strength  Visit Diagnosis: Chronic pain of right knee  Difficulty in walking, not elsewhere classified  Muscle weakness (generalized)  Sciatica, right side  Chronic pain of left knee     Problem List There are no active problems to display for this patient.   Joneen Boers PT, DPT   11/06/2016, 12:21 PM  [facility] [address], [place], [address] Phone: [phone]   Fax:  [phone]  Name: CHEYEANNE JARMAN MRN: [id number] Date of  Birth: Aug 31, 1933

## 2016-11-06 NOTE — Patient Instructions (Signed)
Pt was recommended to perform her seated R knee (and L knee) flexion AAROM using ball 10x3 daily to help maintain her knee mobility. Pt demonstrated and verbalized understanding.

## 2016-11-08 ENCOUNTER — Ambulatory Visit: Payer: Medicare Other

## 2016-11-08 DIAGNOSIS — M25561 Pain in right knee: Principal | ICD-10-CM

## 2016-11-08 DIAGNOSIS — G8929 Other chronic pain: Secondary | ICD-10-CM

## 2016-11-08 DIAGNOSIS — M5431 Sciatica, right side: Secondary | ICD-10-CM

## 2016-11-08 DIAGNOSIS — R262 Difficulty in walking, not elsewhere classified: Secondary | ICD-10-CM

## 2016-11-08 DIAGNOSIS — M25562 Pain in left knee: Secondary | ICD-10-CM

## 2016-11-08 DIAGNOSIS — M6281 Muscle weakness (generalized): Secondary | ICD-10-CM

## 2016-11-08 NOTE — Patient Instructions (Signed)
Seated R knee flexion AAROM using slick board in sitting to promote knee flexion 10x3. Reviewed and given as part of her HEP. Pt demonstrated and verbalized understanding.

## 2016-11-08 NOTE — Therapy (Signed)
[facility] [address] [place], [address] Phone: [phone]   Fax:  [phone]  Physical Therapy Evaluation  Patient Details  Name: Andrea Hester MRN: [id number] Date of Birth: 1933/04/20 Referring Provider: Governor Specking III, MD  Encounter Date: 11/08/2016      PT End of Session - 11/08/16 1122    Visit Number 26   Number of Visits 55   Date for PT Re-Evaluation 11/30/16   Authorization Type 9   Authorization Time Period of 10   PT Start Time 1122   PT Stop Time 1210   PT Time Calculation (min) 48 min   Activity Tolerance Patient tolerated treatment well   Behavior During Therapy [facility] for tasks assessed/performed      Past Medical History:  Diagnosis Date  . Allergy   . GERD (gastroesophageal reflux disease)   . H/O Clostridium difficile infection   . History of palpitations   . Hyperlipidemia   . Migraines   . White coat hypertension     Past Surgical History:  Procedure Laterality Date  . GALLBLADDER SURGERY    . TONSILLECTOMY      There were no vitals filed for this visit.       Subjective Assessment - 11/08/16 1124    Subjective Pt states that R knee feels better. R knee is 4/10, The sciatic symptoms are good but noticed it this morning (4/10 currently), 2/10 L knee.  Pt later adds at end of session that working on the R knee seems to help with her sciatic symptoms.    Pertinent History Bilateral knee pain. Pain began about 4 years ago when pt was going up and down the stairs. Used biofreeze which helped for about a year. 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 Pt states that her knees progressively worsened. Pt also has difficulty going up and down stairs to do laundry due to her knee pain.  Has not had any imaging for her knees.  Pt also states she might have pulled a muscle behind her R thigh from a balance exercise  (standing and leaning back) that she did.    Patient Stated Goals Be able to get out and get to church. Walk better. Pt adds that she wants to get rid of her cane.    Currently in Pain? Yes   Pain Score 4    Pain Onset More than a month ago                               PT Education - 11/08/16 1150    Education provided Yes   Education Details ther-ex, joint mobility, synovial fluid, HEP   Person(s) Educated Patient   Methods Explanation;Demonstration;Tactile cues;Verbal cues;Handout   Comprehension Returned demonstration;Verbalized understanding       Objective  There-ex  Directed patient with seated R knee flexion AAROM using slick board in sitting to promote knee flexion 10x3.   Reviewed and given as part of her HEP. Pt demonstrated and verbalized understanding.   Seated R hip extension isometrics 10x2 with 5 seconds with towel under R thigh  Decreased sciatic symptoms back to 4/10 (from increased symptoms from R hip IR gentle manual stretch)  Improved exercise technique, movement at target joints, use of target muscles after min verbal, visual, tactile cues.  Manual therapy  Seated with R knee flexed             A to P to tibia grade 3+   A to P and P to A to R proximal fibular head  Medial glide to R patella grade 3    Decreased R knee pain and stiffness.   Manual gentle  R hip IR stretch in supine by PT, R hip in hooklying position  Slight increased in sciatic symptoms   Supine R hip axial compression in hooklying position for R LE (feels better with compression per pt while performing pressure)  No change in sciatic symptoms in sitting afterwards.    Increased R sciatic symptoms with manual gentle R hip IR stretch. Decreased with seated R hip extension isometrics to activate the R glute max muscle. Good carry over with R knee pain from last session. Continued working on R knee mobility followed by AAROM knee flexion to maintain  motion. Per pt, increasing R knee mobility seems to help with her sciatic pain.          PT Long Term Goals - 10/19/16 1957      PT LONG TERM GOAL #1   Title Patient will have a decrease in R knee pain to 3/10 or less at worst and L knee pain to 2/10 or less at worst to promote ability to stand up from a chair, ascend and descend stairs, and pick up items from the ground.    Baseline Pain at worst: R knee 7/10, L knee 5/10; 6/10 R knee pain at worst, 5/10 L knee pain at worst (06/29/2016 and 07/27/2016); L knee pain 2/10 at worst,  4.5/10  R knee pain at most for the past 7 days (not counting the morning stiffness) 08/29/2016.    For the past 7 days: 5.5/10 R knee, 4/10 L knee, (not counting the morning stiffness) and 6/10 R gastroc at most (09/13/2016);  For the past 7 days: 6-7/10 R knee, 4-5/10 L knee, 8/10 R gastroc/sciatic pain at most (10/19/2016)   Time 6   Period Weeks   Status On-going     PT LONG TERM GOAL #2   Title Patient will improve her LEFS score by at least 9 points as a demonstration of improved function.    Baseline 26/80; 29/80 (07/25/2016); 33/80 (08/29/2016); 40/80 (10/19/2016)   Time 6   Period Weeks   Status Achieved     PT LONG TERM GOAL #3   Title Patient will improve bilateral hip abduction and knee flexion strength by at least 1/2 MMT grade to promote ability to negotiate stairs and pick up items from the floor.    Time 6   Period Weeks   Status Achieved     PT LONG TERM GOAL #4   Title Patient will be able to ascend and descend 4 regular steps 3 times with bilateral UE assist with minimal to no complain of knee pain to promote ability to go up and down her stairs at home.    Baseline Increased bilateral knee pain with stair negotiation. Able to ascend and descend 4 regular steps with bilateral UE assist mod I with improved femoral control and without knee pain (after manual therapy). Able to perform today (after hip exercises) with minimal to no reports of  knee pain with reciprocal gait pattern (08/29/2016)   Time 5   Period Weeks   Status Achieved     PT LONG TERM GOAL #5   Title Patient  will be able to ambulate at least 500 ft without LOB and without use of AD.    Baseline Pt currently ambulates with a SPC. Pt able to ambulate up to 300 ft without SPC (CGA) and without LOB 09/13/2016.   Time 6   Period Weeks   Status On-going               Plan - 11/08/16 1150    Clinical Impression Statement Increased R sciatic symptoms with manual gentle R hip IR stretch. Decreased with seated R hip extension isometrics to activate the R glute max muscle. Good carry over with R knee pain from last session. Continued working on R knee mobility followed by AAROM knee flexion to maintain motion. Per pt, increasing R knee mobility seems to help with her sciatic pain.    Rehab Potential Good   Clinical Impairments Affecting Rehab Potential Chronicity of condition, age   PT Frequency 2x / week   PT Duration 6 weeks   PT Treatment/Interventions Therapeutic exercise;Manual techniques;Aquatic Therapy;Ultrasound;Advice worker;Therapeutic activities;Patient/family education;Neuromuscular re-education;Dry needling   PT Next Visit Plan femoral control, mechanics with stair negotiation, movement, hip strengthening, HEP   Consulted and Agree with Plan of Care Patient      Patient will benefit from skilled therapeutic intervention in order to improve the following deficits and impairments:  Pain, Improper body mechanics, Decreased strength  Visit Diagnosis: Chronic pain of right knee  Difficulty in walking, not elsewhere classified  Muscle weakness (generalized)  Sciatica, right side  Chronic pain of left knee     Problem List There are no active problems to display for this patient.    Joneen Boers PT, DPT   11/08/2016, 12:25 PM  [facility] [address], [place], [address] Phone: [phone]   Fax:  [phone]  Name: TRINICE BREON MRN: [id number] Date of Birth: 06/21/1933

## 2016-11-13 ENCOUNTER — Ambulatory Visit: Payer: Medicare Other | Attending: Orthopedic Surgery

## 2016-11-13 DIAGNOSIS — M5431 Sciatica, right side: Secondary | ICD-10-CM | POA: Insufficient documentation

## 2016-11-13 DIAGNOSIS — M25561 Pain in right knee: Secondary | ICD-10-CM | POA: Insufficient documentation

## 2016-11-13 DIAGNOSIS — M25562 Pain in left knee: Secondary | ICD-10-CM | POA: Insufficient documentation

## 2016-11-13 DIAGNOSIS — R262 Difficulty in walking, not elsewhere classified: Secondary | ICD-10-CM | POA: Diagnosis present

## 2016-11-13 DIAGNOSIS — G8929 Other chronic pain: Secondary | ICD-10-CM | POA: Diagnosis present

## 2016-11-13 DIAGNOSIS — M6281 Muscle weakness (generalized): Secondary | ICD-10-CM | POA: Diagnosis present

## 2016-11-13 NOTE — Therapy (Signed)
[facility] [address], [place], [address] Phone: [phone]   Fax:  [phone]  Physical Therapy Treatment And Progress Report  Patient Details  Name: FELICA COWARD MRN: [id number] Date of Birth: 1933/09/08 Referring Provider: Governor Specking III, MD  Encounter Date: 11/13/2016      PT End of Session - 11/13/16 1353    Visit Number 27   Number of Visits 55   Date for PT Re-Evaluation 11/30/16   Authorization Type 1   Authorization Time Period of 10   PT Start Time 1353   PT Stop Time 1430   PT Time Calculation (min) 37 min   Activity Tolerance Patient tolerated treatment well   Behavior During Therapy [facility] for tasks assessed/performed      Past Medical History:  Diagnosis Date  . Allergy   . GERD (gastroesophageal reflux disease)   . H/O Clostridium difficile infection   . History of palpitations   . Hyperlipidemia   . Migraines   . White coat hypertension     Past Surgical History:  Procedure Laterality Date  . GALLBLADDER SURGERY    . TONSILLECTOMY      There were no vitals filed for this visit.      Subjective Assessment - 11/13/16 1353    Subjective Pt states R knee is stiff, 3/10 currently (4-5/10 R knee pain at most for the past 7 days, not including the morning stiffness). R sciatic 4-5/10 R sciatic pain currently (4-5/10 R sciatic pain at most for the past 7 days). L knee is a 2/10 currently (just stiff; 3/10 at most for the past 7 days, not including the morning stiffness).    Pertinent History Bilateral knee pain. Pain began about 4 years ago when pt was going up and down the stairs. Used biofreeze which helped for about a year. 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 Pt states that her knees progressively worsened. Pt also has difficulty going up and down stairs to do laundry due to her knee pain.  Has not had any  imaging for her knees.  Pt also states she might have pulled a muscle behind her R thigh from a balance exercise (standing and leaning back) that she did.    Patient Stated Goals Be able to get out and get to church. Walk better. Pt adds that she wants to get rid of her cane.    Currently in Pain? Yes   Pain Score 5   4-5/10 R sciatic, 3/10 R knee   Pain Onset More than a month ago                                 PT Education - 11/13/16 1851    Education provided Yes   Education Details ther-ex, manual therapy to decrease R knee stiffness and piriformis tension   Person(s) Educated Patient   Methods Explanation;Tactile cues;Demonstration;Verbal cues   Comprehension Returned demonstration;Verbalized understanding        Objectives   Manual therapy  Seated with R knee flexed   Medial patellar glide grade 3  Inferior, then inferior medial patellar glide grade 3             A to P to tibia with IR grade 3+   Supine with R knee straight and supported with towel rolls: gentle  hip IR grade 2 to 3-  Decreased sciatic symptoms.   STM to R piriformis muscle in L S/L with slight stretch   Centralized sciatic symptoms to R posterior hip.    There-ex  Seated R hip IR 10x3. Centralized sciatic symptoms  Improved exercise technique, movement at target joints, use of target muscles after mod verbal, visual, tactile cues.    Pt making slow but steady progress towards decreased R knee and sciatic pain with manual therapy to promote R knee joint mobility (patella and tibiofemoral joint) and soft tissue mobilization to decrease R piriformis mucles tension. Pt still demonstrates bilateral knee pain, stiffness, pain along her R sciatic nerve and and difficulty performing functional tasks such as driving and walking without use of her AD. Pt would benefit from continued skilled physical therapy services to address the aforementioned deficits.           PT  Long Term Goals - 11/13/16 1854      PT LONG TERM GOAL #1   Title Patient will have a decrease in R knee pain to 3/10 or less at worst and L knee pain to 2/10 or less at worst to promote ability to stand up from a chair, ascend and descend stairs, and pick up items from the ground.    Baseline Pain at worst: R knee 7/10, L knee 5/10; 6/10 R knee pain at worst, 5/10 L knee pain at worst (06/29/2016 and 07/27/2016); L knee pain 2/10 at worst,  4.5/10  R knee pain at most for the past 7 days (not counting the morning stiffness) 08/29/2016.    For the past 7 days: 5.5/10 R knee, 4/10 L knee, (not counting the morning stiffness) and 6/10 R gastroc at most (09/13/2016);  For the past 7 days: 6-7/10 R knee, 4-5/10 L knee, 8/10 R gastroc/sciatic pain at most (10/19/2016);  For the past 7 days: 4-5/10 R knee, 3/10 L knee, 4-5/10 R sciatic pain at most for the past 7 days (11/13/2016)    Time 6   Period Weeks   Status On-going     PT LONG TERM GOAL #2   Title Patient will improve her LEFS score by at least 9 points as a demonstration of improved function.    Baseline 26/80; 29/80 (07/25/2016); 33/80 (08/29/2016); 40/80 (10/19/2016)   Time 6   Period Weeks   Status Achieved     PT LONG TERM GOAL #3   Title Patient will improve bilateral hip abduction and knee flexion strength by at least 1/2 MMT grade to promote ability to negotiate stairs and pick up items from the floor.    Time 6   Period Weeks   Status Achieved     PT LONG TERM GOAL #4   Title Patient will be able to ascend and descend 4 regular steps 3 times with bilateral UE assist with minimal to no complain of knee pain to promote ability to go up and down her stairs at home.    Baseline Increased bilateral knee pain with stair negotiation. Able to ascend and descend 4 regular steps with bilateral UE assist mod I with improved femoral control and without knee pain (after manual therapy). Able to perform today (after hip exercises) with minimal to no  reports of knee pain with reciprocal gait pattern (08/29/2016)   Time 5   Period Weeks   Status Achieved     PT LONG TERM GOAL #5   Title Patient will be able to ambulate at least  500 ft without LOB and without use of AD.    Baseline Pt currently ambulates with a SPC. Pt able to ambulate up to 300 ft without SPC (CGA) and without LOB 09/13/2016.   Time 6   Period Weeks   Status On-going               Plan - 12/11/2016 1428    Clinical Impression Statement Pt making slow but steady progress towards decreased R knee and sciatic pain with manual therapy to promote R knee joint mobility (patella and tibiofemoral joint) and soft tissue mobilization to decrease R piriformis mucles tension. Pt still demonstrates bilateral knee pain, stiffness, pain along her R sciatic nerve and and difficulty performing functional tasks such as driving and walking without use of her AD. Pt would benefit from continued skilled physical therapy services to address the aforementioned deficits.    Rehab Potential Good   Clinical Impairments Affecting Rehab Potential Chronicity of condition, age   PT Frequency 2x / week   PT Duration 6 weeks   PT Treatment/Interventions Therapeutic exercise;Manual techniques;Aquatic Therapy;Ultrasound;Advice worker;Therapeutic activities;Patient/family education;Neuromuscular re-education;Dry needling   PT Next Visit Plan femoral control, mechanics with stair negotiation, movement, hip strengthening, HEP   Consulted and Agree with Plan of Care Patient      Patient will benefit from skilled therapeutic intervention in order to improve the following deficits and impairments:  Pain, Improper body mechanics, Decreased strength  Visit Diagnosis: Chronic pain of right knee  Difficulty in walking, not elsewhere classified  Muscle weakness (generalized)  Sciatica, right side  Chronic pain of left knee       G-Codes - 12/11/16 1853    Functional  Assessment Tool Used LEFS, clinical presentation, patient interview   Functional Limitation Mobility: Walking and moving around   Mobility: Walking and Moving Around Current Status [id number]) At least 60 percent but less than 80 percent impaired, limited or restricted   Mobility: Walking and Moving Around Goal Status [id number]) At least 20 percent but less than 40 percent impaired, limited or restricted      Problem List There are no active problems to display for this patient.   Thank you for your referral.   Joneen Boers PT, DPT   2016-12-11, 7:06 PM  [facility] [address], [place], [address] Phone: [phone]   Fax:  [phone]  Name: AYLISSA LOHN MRN: [id number] Date of Birth: 03/07/1933

## 2016-11-15 ENCOUNTER — Ambulatory Visit: Payer: Medicare Other

## 2016-11-16 ENCOUNTER — Ambulatory Visit: Payer: Medicare Other

## 2016-11-16 DIAGNOSIS — M6281 Muscle weakness (generalized): Secondary | ICD-10-CM

## 2016-11-16 DIAGNOSIS — M25561 Pain in right knee: Principal | ICD-10-CM

## 2016-11-16 DIAGNOSIS — M25562 Pain in left knee: Secondary | ICD-10-CM

## 2016-11-16 DIAGNOSIS — M5431 Sciatica, right side: Secondary | ICD-10-CM

## 2016-11-16 DIAGNOSIS — R262 Difficulty in walking, not elsewhere classified: Secondary | ICD-10-CM

## 2016-11-16 DIAGNOSIS — G8929 Other chronic pain: Secondary | ICD-10-CM

## 2016-11-16 NOTE — Patient Instructions (Addendum)
Side Bend    Holding onto something sturdy for support.  Stand with feet shoulder width apart. Bend to the left comfortably   Hold for 5 seconds   Repeat sequence _10___ times per session.  Perform 3 sets daily.    Copyright  VHI. All rights reserved.       In sitting,   Place heating pad on your right thigh and knee for 15 min.   Then massage the outside of your right thigh.   Then perform the knee bending exercise 3 sets of 10.

## 2016-11-16 NOTE — Therapy (Signed)
[facility] [address], [place], [address] Phone: [phone]   Fax:  [phone]  Physical Therapy Treatment  Patient Details  Name: Andrea Hester MRN: [id number] Date of Birth: [birth year] Referring Provider: Governor Specking III, MD  Encounter Date: 11/16/2016      PT End of Session - 11/16/16 1034    Visit Number 28   Number of Visits 55   Date for PT Re-Evaluation 11/30/16   Authorization Type 2   Authorization Time Period of 10   PT Start Time 1034   PT Stop Time 1121   PT Time Calculation (min) 47 min   Activity Tolerance Patient tolerated treatment well   Behavior During Therapy [facility] for tasks assessed/performed      Past Medical History:  Diagnosis Date  . Allergy   . GERD (gastroesophageal reflux disease)   . H/O Clostridium difficile infection   . History of palpitations   . Hyperlipidemia   . Migraines   . White coat hypertension     Past Surgical History:  Procedure Laterality Date  . GALLBLADDER SURGERY    . TONSILLECTOMY      There were no vitals filed for this visit.      Subjective Assessment - 11/16/16 1035    Subjective R knee is hurting. 3-4/10 currently. The R sciatic seems to be going down now from the R hip R lateral thigh and R lateral leg. Feels like it starts from the back her R hip bone (5/10 currently). L knee is 3/10 currently (it sometimes gives her a jolt from time to time).    Pertinent History Bilateral knee pain. Pain began about 4 years ago when pt was going up and down the stairs. Used biofreeze which helped for about a year. 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 Pt states that her knees progressively worsened. Pt also has difficulty going up and down stairs to do laundry due to her knee pain.  Has not had any imaging for her knees.  Pt also states she might have pulled a muscle behind her R  thigh from a balance exercise (standing and leaning back) that she did.    Patient Stated Goals Be able to get out and get to church. Walk better. Pt adds that she wants to get rid of her cane.    Currently in Pain? Yes   Pain Score 5   R LE symptoms, 3-4/10 R knee, 3/10 L knee   Pain Onset More than a month ago                                 PT Education - 11/16/16 1057    Education provided Yes   Education Details ther-ex   [employer]) Educated Patient   Methods Explanation;Demonstration;Tactile cues;Verbal cues   Comprehension Returned demonstration;Verbalized understanding        Objectives   Manual therapy  Seated with R knee flexed              Medial patellar glide grade 3             Inferior, then inferior medial patellar glide grade 3 A to P to tibia grade 3+. Slight reproduction of R sciatic symptoms. Eaes with rest.    Decreased R knee pain overall after manual therapy   There-ex  Seated trunk flexion. Decreased R lateral LE symptoms.   Seated ball rolls for trunk flexion: forward 10x5 seconds for 2 sets  To the L 10x5 seconds. Increased R LE symptoms at first but centralized to R low back at rest. Then 2x5 with 5 seconds  Standing R LE weight shifting 10x5 seconds for 2 sets with bilateral UE assist.   Standing L trunk side bend with bilateral UE assist 10x2 with 5 second holds    Decreased R LE symptoms to 4/10    Improved exercise technique, movement at target joints, use of target muscles after mod verbal, visual, tactile cues.     R LE symptoms today demonstrates low back origin around L5 dermatome compared to her previous sciatic symptoms. Decreased R LE pain with exercises to promote R low back intervertebral foraminal space. Pt oberved to walk better without AD with improved step length. R knee stiffness and pain improves following manual therapy. Stiffness returns after about 10-15 minutes of not  bending her R knee. Pt however improving R knee pain levels overall.          PT Long Term Goals - 11/13/16 1854      PT LONG TERM GOAL #1   Title Patient will have a decrease in R knee pain to 3/10 or less at worst and L knee pain to 2/10 or less at worst to promote ability to stand up from a chair, ascend and descend stairs, and pick up items from the ground.    Baseline Pain at worst: R knee 7/10, L knee 5/10; 6/10 R knee pain at worst, 5/10 L knee pain at worst (06/29/2016 and 07/27/2016); L knee pain 2/10 at worst,  4.5/10  R knee pain at most for the past 7 days (not counting the morning stiffness) 08/29/2016.    For the past 7 days: 5.5/10 R knee, 4/10 L knee, (not counting the morning stiffness) and 6/10 R gastroc at most (09/13/2016);  For the past 7 days: 6-7/10 R knee, 4-5/10 L knee, 8/10 R gastroc/sciatic pain at most (10/19/2016);  For the past 7 days: 4-5/10 R knee, 3/10 L knee, 4-5/10 R sciatic pain at most for the past 7 days (11/13/2016)    Time 6   Period Weeks   Status On-going     PT LONG TERM GOAL #2   Title Patient will improve her LEFS score by at least 9 points as a demonstration of improved function.    Baseline 26/80; 29/80 (07/25/2016); 33/80 (08/29/2016); 40/80 (10/19/2016)   Time 6   Period Weeks   Status Achieved     PT LONG TERM GOAL #3   Title Patient will improve bilateral hip abduction and knee flexion strength by at least 1/2 MMT grade to promote ability to negotiate stairs and pick up items from the floor.    Time 6   Period Weeks   Status Achieved     PT LONG TERM GOAL #4   Title Patient will be able to ascend and descend 4 regular steps 3 times with bilateral UE assist with minimal to no complain of knee pain to promote ability to go up and down her stairs at home.    Baseline Increased bilateral knee pain with stair negotiation. Able to ascend and descend 4 regular steps with bilateral UE assist mod I with improved femoral control and without knee  pain (after manual therapy). Able to perform today (after hip exercises) with minimal to no reports of knee pain with  reciprocal gait pattern (08/29/2016)   Time 5   Period Weeks   Status Achieved     PT LONG TERM GOAL #5   Title Patient will be able to ambulate at least 500 ft without LOB and without use of AD.    Baseline Pt currently ambulates with a SPC. Pt able to ambulate up to 300 ft without SPC (CGA) and without LOB 09/13/2016.   Time 6   Period Weeks   Status On-going               Plan - 11/16/16 1108    Clinical Impression Statement R LE symptoms today demonstrates low back origin around L5 dermatome compared to her previous sciatic symptoms. Decreased R LE pain with exercises to promote R low back intervertebral foraminal space. Pt oberved to walk better without AD with improved step length. R knee stiffness and pain improves following manual therapy. Stiffness returns after about 10-15 minutes of not bending her R knee. Pt however improving R knee pain levels overall.    Rehab Potential Good   Clinical Impairments Affecting Rehab Potential Chronicity of condition, age   PT Frequency 2x / week   PT Duration 6 weeks   PT Treatment/Interventions Therapeutic exercise;Manual techniques;Aquatic Therapy;Ultrasound;Advice worker;Therapeutic activities;Patient/family education;Neuromuscular re-education;Dry needling   PT Next Visit Plan femoral control, mechanics with stair negotiation, movement, hip strengthening, HEP   Consulted and Agree with Plan of Care Patient      Patient will benefit from skilled therapeutic intervention in order to improve the following deficits and impairments:  Pain, Improper body mechanics, Decreased strength  Visit Diagnosis: Chronic pain of right knee  Difficulty in walking, not elsewhere classified  Muscle weakness (generalized)  Sciatica, right side  Chronic pain of left knee     Problem List There are no  active problems to display for this patient.   Joneen Boers PT, DPT  11/16/2016, 2:36 PM  [facility] [address], [place], [address] Phone: [phone]   Fax:  [phone]  Name: Andrea Hester MRN: [id number] Date of Birth: 10-04-1933

## 2016-11-20 ENCOUNTER — Ambulatory Visit: Payer: Medicare Other

## 2016-11-20 DIAGNOSIS — G8929 Other chronic pain: Secondary | ICD-10-CM

## 2016-11-20 DIAGNOSIS — M25562 Pain in left knee: Secondary | ICD-10-CM

## 2016-11-20 DIAGNOSIS — M25561 Pain in right knee: Secondary | ICD-10-CM

## 2016-11-20 DIAGNOSIS — M5431 Sciatica, right side: Secondary | ICD-10-CM

## 2016-11-20 DIAGNOSIS — R262 Difficulty in walking, not elsewhere classified: Secondary | ICD-10-CM

## 2016-11-20 DIAGNOSIS — M6281 Muscle weakness (generalized): Secondary | ICD-10-CM

## 2016-11-20 NOTE — Therapy (Signed)
[facility] [address] [place], [address] Phone: [phone]   Fax:  [phone]  Physical Therapy Treatment  Patient Details  Name: Andrea Hester MRN: [id number] Date of Birth: 12-25-1932 Referring Provider: Governor Specking III, MD  Encounter Date: 11/20/2016      PT End of Session - 11/20/16 1304    Visit Number 29   Number of Visits 55   Date for PT Re-Evaluation 11/30/16   Authorization Type 3   Authorization Time Period of 10   PT Start Time 1304   PT Stop Time 1359   PT Time Calculation (min) 55 min   Activity Tolerance Patient tolerated treatment well   Behavior During Therapy [facility] for tasks assessed/performed      Past Medical History:  Diagnosis Date  . Allergy   . GERD (gastroesophageal reflux disease)   . H/O Clostridium difficile infection   . History of palpitations   . Hyperlipidemia   . Migraines   . White coat hypertension     Past Surgical History:  Procedure Laterality Date  . GALLBLADDER SURGERY    . TONSILLECTOMY      There were no vitals filed for this visit.      Subjective Assessment - 11/20/16 1306    Subjective R knee is a 3/10 currently. The R knee feels a little bit better since last session. The stiffness is not as bad. The standing side bend exercise helps. The knee exercises HEP (with moist heat, STM to R lateral quad, and knee bending) helps. The sciatic nerve (lateral R LE) still bothers her when driving in the car for about 15 minutes. Has not yet been able to drive to church (20 min).  3.5/10 R Sciatic pain currently.    Pertinent History Bilateral knee pain. Pain began about 4 years ago when pt was going up and down the stairs. Used biofreeze which helped for about a year. 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 Pt states that her knees progressively worsened. Pt also has difficulty going up  and down stairs to do laundry due to her knee pain.  Has not had any imaging for her knees.  Pt also states she might have pulled a muscle behind her R thigh from a balance exercise (standing and leaning back) that she did.    Patient Stated Goals Be able to get out and get to church. Walk better. Pt adds that she wants to get rid of her cane.    Currently in Pain? Yes   Pain Score 3   3/10 R knee   Pain Onset More than a month ago                                 PT Education - 11/20/16 1412    Education provided Yes   Education Details ther-ex, HEP   Person(s) Educated Patient   Methods Explanation;Demonstration;Tactile cues;Verbal cues;Handout   Comprehension Verbalized understanding;Returned demonstration        Objectives   There-ex  In car seat with lumbar pillow at pelvis (to promote lumbar flexion):    Seated bilateral shoulder extension isometrics 10x2 with 5 seconds for trunk muscle activation   Seated bilateral shoulder extension isometrics resisting SPC with PT with R hip abduction 10x3. Decreased symptoms with transfering R foot from L to R  Seated R hip extension isometrics with towel under thigh 10x2 with 5 seconds. Decreased R lateral hip sytmptoms when moving R foot from R to L.    Forward step up onto 1st regular step 8x with bilateral UE assist. R posterior thigh symptoms which decreases with rest. Pt demonstrates R trunk side bend when stepping back down.   Reviewed and given L S/L R piriformis stretch (same position as manual therapy) with heating pad to R piriformis as part of her HEP. Pt demonstrated and verbalized understanding.     Improved exercise technique, movement at target joints, use of target muscles after mod verbal, visual, tactile cues.     Manual therapy  L S/L STM to R piriformis in slight stretched position     Decreased R sciatic symptoms to 2.5/10 to 3/10 after manual therapy to R piriformis. Decreased  R lateral thigh and lateral knee pain. Decreased R knee pain to 2.5/10 to 3/10 after sesssion. Continue working on decreasing R piriformis activation, and increasing R lumbar intervertebral foraminal space, trunk muscle activation, R glute max muscle use to promote ability to drive with less R sciatic symptoms. Pt making steady progress with decreasing R knee pain and stiffness from previous sessions as well as decreasing R LE (L5 dermatomal/and sciatic) symptoms.         PT Long Term Goals - 11/13/16 1854      PT LONG TERM GOAL #1   Title Patient will have a decrease in R knee pain to 3/10 or less at worst and L knee pain to 2/10 or less at worst to promote ability to stand up from a chair, ascend and descend stairs, and pick up items from the ground.    Baseline Pain at worst: R knee 7/10, L knee 5/10; 6/10 R knee pain at worst, 5/10 L knee pain at worst (06/29/2016 and 07/27/2016); L knee pain 2/10 at worst,  4.5/10  R knee pain at most for the past 7 days (not counting the morning stiffness) 08/29/2016.    For the past 7 days: 5.5/10 R knee, 4/10 L knee, (not counting the morning stiffness) and 6/10 R gastroc at most (09/13/2016);  For the past 7 days: 6-7/10 R knee, 4-5/10 L knee, 8/10 R gastroc/sciatic pain at most (10/19/2016);  For the past 7 days: 4-5/10 R knee, 3/10 L knee, 4-5/10 R sciatic pain at most for the past 7 days (11/13/2016)    Time 6   Period Weeks   Status On-going     PT LONG TERM GOAL #2   Title Patient will improve her LEFS score by at least 9 points as a demonstration of improved function.    Baseline 26/80; 29/80 (07/25/2016); 33/80 (08/29/2016); 40/80 (10/19/2016)   Time 6   Period Weeks   Status Achieved     PT LONG TERM GOAL #3   Title Patient will improve bilateral hip abduction and knee flexion strength by at least 1/2 MMT grade to promote ability to negotiate stairs and pick up items from the floor.    Time 6   Period Weeks   Status Achieved     PT LONG TERM  GOAL #4   Title Patient will be able to ascend and descend 4 regular steps 3 times with bilateral UE assist with minimal to no complain of knee pain to promote ability to go up and down her stairs at home.    Baseline Increased bilateral knee pain with stair negotiation. Able to ascend and  descend 4 regular steps with bilateral UE assist mod I with improved femoral control and without knee pain (after manual therapy). Able to perform today (after hip exercises) with minimal to no reports of knee pain with reciprocal gait pattern (08/29/2016)   Time 5   Period Weeks   Status Achieved     PT LONG TERM GOAL #5   Title Patient will be able to ambulate at least 500 ft without LOB and without use of AD.    Baseline Pt currently ambulates with a SPC. Pt able to ambulate up to 300 ft without SPC (CGA) and without LOB 09/13/2016.   Time 6   Period Weeks   Status On-going               Plan - 11/20/16 1412    Clinical Impression Statement Decreased R sciatic symptoms to 2.5/10 to 3/10 after manual therapy to R piriformis. Decreased R lateral thigh and lateral knee pain. Decreased R knee pain to 2.5/10 to 3/10 after sesssion. Continue working on decreasing R piriformis activation, and increasing R lumbar intervertebral foraminal space, trunk muscle activation, R glute max muscle use to promote ability to drive with less R sciatic symptoms. Pt making steady progress with decreasing R knee pain and stiffness from previous sessions as well as decreasing R LE (L5 dermatomal/and sciatic) symptoms.     Rehab Potential Good   Clinical Impairments Affecting Rehab Potential Chronicity of condition, age   PT Frequency 2x / week   PT Duration 6 weeks   PT Treatment/Interventions Therapeutic exercise;Manual techniques;Aquatic Therapy;Ultrasound;Advice worker;Therapeutic activities;Patient/family education;Neuromuscular re-education;Dry needling   PT Next Visit Plan femoral control,  mechanics with stair negotiation, movement, hip strengthening, HEP   Consulted and Agree with Plan of Care Patient      Patient will benefit from skilled therapeutic intervention in order to improve the following deficits and impairments:  Pain, Improper body mechanics, Decreased strength  Visit Diagnosis: Sciatica, right side  Chronic pain of right knee  Difficulty in walking, not elsewhere classified  Muscle weakness (generalized)  Chronic pain of left knee     Problem List There are no active problems to display for this patient.   Joneen Boers PT, DPT   11/20/2016, 2:20 PM  [facility] [address], [place], [address] Phone: [phone]   Fax:  [phone]  Name: Andrea Hester MRN: [id number] Date of Birth: August 31, 1933

## 2016-11-20 NOTE — Patient Instructions (Signed)
   Lie down on your left side to feel a comfortable stretch behind your right hip.    Place a comfortable heating behind your right hip for 15 min.     Take breaks as needed.    Repeat 3 times a day.

## 2016-11-23 ENCOUNTER — Ambulatory Visit: Payer: Medicare Other

## 2016-11-23 DIAGNOSIS — R262 Difficulty in walking, not elsewhere classified: Secondary | ICD-10-CM

## 2016-11-23 DIAGNOSIS — M25561 Pain in right knee: Secondary | ICD-10-CM | POA: Diagnosis not present

## 2016-11-23 DIAGNOSIS — M25562 Pain in left knee: Secondary | ICD-10-CM

## 2016-11-23 DIAGNOSIS — G8929 Other chronic pain: Secondary | ICD-10-CM

## 2016-11-23 DIAGNOSIS — M5431 Sciatica, right side: Secondary | ICD-10-CM

## 2016-11-23 DIAGNOSIS — M6281 Muscle weakness (generalized): Secondary | ICD-10-CM

## 2016-11-23 NOTE — Therapy (Signed)
[facility] [address] [place], [address] Phone: [phone]   Fax:  [phone]  Physical Therapy Treatment  Patient Details  Name: Andrea Hester MRN: [id number] Date of Birth: 12/10/1932 Referring Provider: Governor Specking III, MD  Encounter Date: 11/23/2016      PT End of Session - 11/23/16 1114    Visit Number 30   Number of Visits 55   Date for PT Re-Evaluation 11/30/16   Authorization Type 4   Authorization Time Period of 10   PT Start Time 1114   PT Stop Time 1206   PT Time Calculation (min) 52 min   Activity Tolerance Patient tolerated treatment well   Behavior During Therapy [facility] for tasks assessed/performed      Past Medical History:  Diagnosis Date  . Allergy   . GERD (gastroesophageal reflux disease)   . H/O Clostridium difficile infection   . History of palpitations   . Hyperlipidemia   . Migraines   . White coat hypertension     Past Surgical History:  Procedure Laterality Date  . GALLBLADDER SURGERY    . TONSILLECTOMY      There were no vitals filed for this visit.      Subjective Assessment - 11/23/16 1115    Subjective My legs are killing me. Might be the weather. 4-5/10 R knee, 3-4/10 L knee (something new developed), 4/10 R sciatic symptoms currently.  A couple of days ago, got up and walked to the bathroom at night and her L knee was killing her.  This is the fourth day that her L knee is bothering her. L knee hurts less when walking sideways to the R.  Moving her L knee helps.  Pt states that physical therapy is helping.    Pertinent History Bilateral knee pain. Pain began about 4 years ago when pt was going up and down the stairs. Used biofreeze which helped for about a year. 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 Pt states that her knees progressively worsened. Pt also has difficulty going up and down  stairs to do laundry due to her knee pain.  Has not had any imaging for her knees.  Pt also states she might have pulled a muscle behind her R thigh from a balance exercise (standing and leaning back) that she did.    Patient Stated Goals Be able to get out and get to church. Walk better. Pt adds that she wants to get rid of her cane.    Currently in Pain? Yes   Pain Score 5    Pain Onset More than a month ago                                 PT Education - 11/23/16 1157    Education provided Yes   Education Details ther-ex, plan of care   Person(s) Educated Patient   Methods Explanation;Demonstration;Tactile cues;Verbal cues   Comprehension Returned demonstration;Verbalized understanding        Objectives   Manual therapy  Seated with L knee flexed:    Medial glide to L patella grade 3  A to P to L tibia grade 3   Decreased L knee pain   Seated with R knee flexed  Medial patellar glide grade 3 Inferior, then inferior medial patellar glide grade 3 A to  P to tibia grade 3. Decreased R knee pain   S/L on L: STM R piriformis muscle  Decreased sciatic pain  to 3.5/10 to 4/10          There-ex  Standing bilateral shoulder extension resisting yellow band with scapular retraction 10x5 seconds for 2 sets to promote thoracic extension and trunk muscle use    Seated thoracic extension over chair 10x5 seconds to promote thoracic extension for 2 sets. Decreased R sciatic symptoms.   Standing bilateral scapular retraction rows resisting yellow band 10x5 seconds  Decreased R sciatic symptoms   Reviewed plan of care: 2x/week x 4 weeks after 11/30/2016 to continue overall progress.    Improved exercise technique, movement at target joints, use of target muscles after mod verbal, visual, tactile cues.      Decreased R sciatic symptoms with exercises to promote thoracic extension. Improved bilateral knee  pain after manual therapy to promote mobility. Continue working on thoracic extension and scapular strengthening as well as decreasing R piriformis activation and improving knee joint mobility.        PT Long Term Goals - 11/13/16 1854      PT LONG TERM GOAL #1   Title Patient will have a decrease in R knee pain to 3/10 or less at worst and L knee pain to 2/10 or less at worst to promote ability to stand up from a chair, ascend and descend stairs, and pick up items from the ground.    Baseline Pain at worst: R knee 7/10, L knee 5/10; 6/10 R knee pain at worst, 5/10 L knee pain at worst (06/29/2016 and 07/27/2016); L knee pain 2/10 at worst,  4.5/10  R knee pain at most for the past 7 days (not counting the morning stiffness) 08/29/2016.    For the past 7 days: 5.5/10 R knee, 4/10 L knee, (not counting the morning stiffness) and 6/10 R gastroc at most (09/13/2016);  For the past 7 days: 6-7/10 R knee, 4-5/10 L knee, 8/10 R gastroc/sciatic pain at most (10/19/2016);  For the past 7 days: 4-5/10 R knee, 3/10 L knee, 4-5/10 R sciatic pain at most for the past 7 days (11/13/2016)    Time 6   Period Weeks   Status On-going     PT LONG TERM GOAL #2   Title Patient will improve her LEFS score by at least 9 points as a demonstration of improved function.    Baseline 26/80; 29/80 (07/25/2016); 33/80 (08/29/2016); 40/80 (10/19/2016)   Time 6   Period Weeks   Status Achieved     PT LONG TERM GOAL #3   Title Patient will improve bilateral hip abduction and knee flexion strength by at least 1/2 MMT grade to promote ability to negotiate stairs and pick up items from the floor.    Time 6   Period Weeks   Status Achieved     PT LONG TERM GOAL #4   Title Patient will be able to ascend and descend 4 regular steps 3 times with bilateral UE assist with minimal to no complain of knee pain to promote ability to go up and down her stairs at home.    Baseline Increased bilateral knee pain with stair negotiation. Able  to ascend and descend 4 regular steps with bilateral UE assist mod I with improved femoral control and without knee pain (after manual therapy). Able to perform today (after hip exercises) with minimal to no reports of knee pain with reciprocal gait pattern (08/29/2016)  Time 5   Period Weeks   Status Achieved     PT LONG TERM GOAL #5   Title Patient will be able to ambulate at least 500 ft without LOB and without use of AD.    Baseline Pt currently ambulates with a SPC. Pt able to ambulate up to 300 ft without SPC (CGA) and without LOB 09/13/2016.   Time 6   Period Weeks   Status On-going               Plan - 11/23/16 1158    Clinical Impression Statement Decreased R sciatic symptoms with exercises to promote thoracic extension. Improved bilateral knee pain after manual therapy to promote mobility. Continue working on thoracic extension and scapular strengthening as well as decreasing R piriformis activation and improving knee joint mobility.    Rehab Potential Good   Clinical Impairments Affecting Rehab Potential Chronicity of condition, age   PT Frequency 2x / week   PT Duration 6 weeks   PT Treatment/Interventions Therapeutic exercise;Manual techniques;Aquatic Therapy;Ultrasound;Advice worker;Therapeutic activities;Patient/family education;Neuromuscular re-education;Dry needling   PT Next Visit Plan femoral control, mechanics with stair negotiation, movement, hip strengthening, HEP   Consulted and Agree with Plan of Care Patient      Patient will benefit from skilled therapeutic intervention in order to improve the following deficits and impairments:  Pain, Improper body mechanics, Decreased strength  Visit Diagnosis: Chronic pain of right knee  Difficulty in walking, not elsewhere classified  Muscle weakness (generalized)  Sciatica, right side  Chronic pain of left knee     Problem List There are no active problems to display for this  patient.   Joneen Boers PT, DPT   11/23/2016, 12:17 PM  [facility] [address], [place], [address] Phone: [phone]   Fax:  [phone]  Name: Andrea Hester MRN: [id number] Date of Birth: 06/18/1933

## 2016-11-28 ENCOUNTER — Ambulatory Visit: Payer: Medicare Other

## 2016-11-28 DIAGNOSIS — M25561 Pain in right knee: Secondary | ICD-10-CM

## 2016-11-28 DIAGNOSIS — G8929 Other chronic pain: Secondary | ICD-10-CM

## 2016-11-28 DIAGNOSIS — R262 Difficulty in walking, not elsewhere classified: Secondary | ICD-10-CM

## 2016-11-28 DIAGNOSIS — M25562 Pain in left knee: Secondary | ICD-10-CM

## 2016-11-28 DIAGNOSIS — M6281 Muscle weakness (generalized): Secondary | ICD-10-CM

## 2016-11-28 DIAGNOSIS — M5431 Sciatica, right side: Secondary | ICD-10-CM

## 2016-11-28 NOTE — Patient Instructions (Addendum)
  Pt was recommended to perform her T-band scapular retraction exercises in sitting so that her L knee does not bother her. Pt verbalized understanding.

## 2016-11-28 NOTE — Addendum Note (Signed)
Addended by: Madaline Savage on: 11/28/2016 06:17 PM   Modules accepted: Orders

## 2016-11-28 NOTE — Therapy (Addendum)
[facility] [address] [place], [address] Phone: [phone]   Fax:  [phone]  Physical Therapy Treatment  Patient Details  Name: Andrea Hester MRN: [id number] Date of Birth: Dec 31, 1932 Referring Provider: Governor Specking III, MD  Encounter Date: 11/28/2016      PT End of Session - 11/28/16 1602    Visit Number 31   Number of Visits 74   Date for PT Re-Evaluation 12/28/16   Authorization Type 5   Authorization Time Period of 10   PT Start Time 1602   PT Stop Time 1700   PT Time Calculation (min) 58 min   Activity Tolerance Patient tolerated treatment well   Behavior During Therapy [facility] for tasks assessed/performed      Past Medical History:  Diagnosis Date  . Allergy   . GERD (gastroesophageal reflux disease)   . H/O Clostridium difficile infection   . History of palpitations   . Hyperlipidemia   . Migraines   . White coat hypertension     Past Surgical History:  Procedure Laterality Date  . GALLBLADDER SURGERY    . TONSILLECTOMY      There were no vitals filed for this visit.      Subjective Assessment - 11/28/16 1604    Subjective Pt states stopping her hydrochlorothiazide (yesterday and today) because she thinks it might be hurting her joints. MD does not know about it. Had 9/10 L knee jointline pain 3-4 days ago. Does not know if she did something.  5/10 L knee pain currently (walking), 3.5/10 to 4/10 R knee pain currently. R sciatic nerve has been doing pretty good, 4.5/10 currently.     3-4/10 R knee pain, and R sciatic nerve pain at most for the past 7 days. R sciatic nerve has not woken her up at night in a while. Pt better able to drive short distances compared to before starting PT.   Pertinent History Bilateral knee pain. Pain began about 4 years ago when pt was going up and down the stairs. Used biofreeze which helped for about a year. 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 Pt states that her knees progressively worsened. Pt also has difficulty going up and down stairs to do laundry due to her knee pain.  Has not had any imaging for her knees.  Pt also states she might have pulled a muscle behind her R thigh from a balance exercise (standing and leaning back) that she did.    Patient Stated Goals Be able to get out and get to church. Walk better. Pt adds that she wants to get rid of her cane.    Currently in Pain? Yes   Pain Score 5    Pain Onset More than a month ago                                 PT Education - 11/28/16 1705    Education provided Yes   Education Details ther-ex   [employer]) Educated Patient   Methods Explanation;Demonstration;Verbal cues;Tactile cues   Comprehension Verbalized understanding;Returned demonstration        Objectives   3-4/10 R knee pain, and R sciatic nerve pain at most for the past 7 days. R sciatic nerve has not woken her up at night in a while. Pt better able to drive  short distances compared to before starting PT.     Manual therapy  Supine manual IR of femur, ER of tibia (stiff) with leg straight, pillow behind knee grade 3. Decreased joint line pain with gait  Supine: medial glide to L patella grade 3-  Inferior glide L patella grade 3-. Discomfort which eases with medial glide of patella  Superior glide to R patella grade 3-. Discomfort which eases with medial glide to patella  STM L VMO in supine to decrease lateral pull of patella   Decreased L knee pain with gait after manual therapy.       There-ex    Blood pressure L arm sitting, mechanically taken secondary to pt stopping her hydrochlorothiazide medication. 166/70, HR 75   Seated glute max squeeze with hip adductor pillow squeeze 8x5 seconds, then 10x5 seconds for 2 sets to promote VMO muscle use.   Gait 64 ft. Slight decrease in L knee pain with  increased walking.  Seated L knee extension with hip adductor ball squeeze 10x5 seconds for 2 sets to promote VMO muscle use      Improved exercise technique, movement at target joints, use of target muscles after mod verbal, visual, tactile cues.        Pt demonstrates overall improvement with R knee and R sciatic pain with worst pain levels decreasing to 3-4/10 at most for the past 7 days. Symptoms improve with manual therapy to help improve R knee joint mobility, decrease R piriformis activation, and exercises to help promote thoracic extension, and lumbar flexion to decrease low back extension pressure. L knee tends not to give pt problems but patient recently experienced increased L knee pain in which manual therapy and movement seems to help. Pt still demonstrates difficulty driving to church due to R sciatic symptoms (improving), as well as walking and performing functional tasks due to bilateral knee pain and R sciatic pain. Patient will benefit from continued skilled physical therapy services to address the aforementioned deficits. Barriers to progress include multiple pain areas and chronicity of condition.               PT Long Term Goals - 11/28/16 1642      PT LONG TERM GOAL #1   Title Patient will have a decrease in R knee pain to 3/10 or less at worst and L knee pain to 2/10 or less at worst to promote ability to stand up from a chair, ascend and descend stairs, and pick up items from the ground.    Baseline Pain at worst: R knee 7/10, L knee 5/10; 6/10 R knee pain at worst, 5/10 L knee pain at worst (06/29/2016 and 07/27/2016); L knee pain 2/10 at worst,  4.5/10  R knee pain at most for the past 7 days (not counting the morning stiffness) 08/29/2016.    For the past 7 days: 5.5/10 R knee, 4/10 L knee, (not counting the morning stiffness) and 6/10 R gastroc at most (09/13/2016);  For the past 7 days: 6-7/10 R knee, 4-5/10 L knee, 8/10 R gastroc/sciatic pain at most  (10/19/2016);  For the past 7 days: 4-5/10 R knee, 3/10 L knee, 4-5/10 R sciatic pain at most for the past 7 days (11/13/2016).    3-4/10 R knee pain, 9/10 L knee pain, 3-4/10 R sciatic pain at most for the past 7 days (11/28/2016)   Time 4   Period Weeks   Status On-going     PT LONG TERM GOAL #2  Title Patient will improve her LEFS score by at least 9 points as a demonstration of improved function.    Baseline 26/80; 29/80 (07/25/2016); 33/80 (08/29/2016); 40/80 (10/19/2016)   Time 6   Period Weeks   Status Achieved     PT LONG TERM GOAL #3   Title Patient will improve bilateral hip abduction and knee flexion strength by at least 1/2 MMT grade to promote ability to negotiate stairs and pick up items from the floor.    Time 6   Period Weeks   Status Achieved     PT LONG TERM GOAL #4   Title Patient will be able to ascend and descend 4 regular steps 3 times with bilateral UE assist with minimal to no complain of knee pain to promote ability to go up and down her stairs at home.    Baseline Increased bilateral knee pain with stair negotiation. Able to ascend and descend 4 regular steps with bilateral UE assist mod I with improved femoral control and without knee pain (after manual therapy). Able to perform today (after hip exercises) with minimal to no reports of knee pain with reciprocal gait pattern (08/29/2016).  L knee pain currently when weight bearing (11/28/2016)   Time 4   Period Weeks   Status On-going     PT LONG TERM GOAL #5   Title Patient will be able to ambulate at least 500 ft without LOB and without use of AD.    Baseline Pt currently ambulates with a SPC. Pt able to ambulate up to 300 ft without SPC (CGA) and without LOB 09/13/2016. Currently ambulates with SPC secondary to knee pain (11/28/2016)   Time 4   Period Weeks   Status On-going     Additional Long Term Goals   Additional Long Term Goals Yes     PT LONG TERM GOAL #6   Title Patient will be able to drive to church  (pt personal goal).    Baseline Pt currently unable to drive to church due to R sciatic pain.    Time 4   Period Weeks   Status New               Plan - 11/28/16 1638    Clinical Impression Statement Pt demonstrates overall improvement with R knee and R sciatic pain with worst pain levels decreasing to 3-4/10 at most for the past 7 days. Symptoms improve with manual therapy to help improve R knee joint mobility, decrease R piriformis activation, and exercises to help promote thoracic extension, and lumbar flexion to decrease low back extension pressure. L knee tends not to give pt problems but patient recently experienced increased L knee pain in which manual therapy and movement seems to help. Pt still demonstrates difficulty driving to church due to R sciatic symptoms (improving), as well as walking and performing functional tasks due to bilateral knee pain and R sciatic pain. Patient will benefit from continued skilled physical therapy services to address the aforementioned deficits. Barriers to progress include multiple pain areas and chronicity of condition.     Rehab Potential Good   Clinical Impairments Affecting Rehab Potential Chronicity of condition, age   PT Frequency 2x / week   PT Duration 6 weeks   PT Treatment/Interventions Therapeutic exercise;Manual techniques;Aquatic Therapy;Ultrasound;Advice worker;Therapeutic activities;Patient/family education;Neuromuscular re-education;Dry needling   PT Next Visit Plan femoral control, mechanics with stair negotiation, movement, hip strengthening, HEP   Consulted and Agree with Plan of Care Patient  Patient will benefit from skilled therapeutic intervention in order to improve the following deficits and impairments:  Pain, Improper body mechanics, Decreased strength  Visit Diagnosis: Sciatica, right side - Plan: PT plan of care cert/re-cert  Chronic pain of right knee - Plan: PT plan of care  cert/re-cert  Difficulty in walking, not elsewhere classified - Plan: PT plan of care cert/re-cert  Muscle weakness (generalized) - Plan: PT plan of care cert/re-cert  Chronic pain of left knee - Plan: PT plan of care cert/re-cert     Problem List There are no active problems to display for this patient.    Joneen Boers PT, DPT  11/28/2016, 6:16 PM  [place] [facility] [address], [place], [address] Phone: [phone]   Fax:  [phone]  Name: Andrea Hester MRN: [id number] Date of Birth: 1933-02-24

## 2016-11-30 ENCOUNTER — Ambulatory Visit: Payer: Medicare Other

## 2016-11-30 DIAGNOSIS — M25562 Pain in left knee: Secondary | ICD-10-CM

## 2016-11-30 DIAGNOSIS — M6281 Muscle weakness (generalized): Secondary | ICD-10-CM

## 2016-11-30 DIAGNOSIS — M25561 Pain in right knee: Principal | ICD-10-CM

## 2016-11-30 DIAGNOSIS — R262 Difficulty in walking, not elsewhere classified: Secondary | ICD-10-CM

## 2016-11-30 DIAGNOSIS — G8929 Other chronic pain: Secondary | ICD-10-CM

## 2016-11-30 DIAGNOSIS — M5431 Sciatica, right side: Secondary | ICD-10-CM

## 2016-11-30 NOTE — Patient Instructions (Signed)
  Sitting on a chair,    Gently squeeze 2 folded pillows between your knees   Hold for 5 seconds.    Repeat 10 times.    Perform 4 sets daily.

## 2016-11-30 NOTE — Therapy (Signed)
[place] [facility] [address] [place], [address] Phone: [phone]   Fax:  [phone]  Physical Therapy Treatment  Patient Details  Name: Andrea Hester MRN: [id number] Date of Birth: 11-08-1932 Referring Provider: Governor Specking III, MD  Encounter Date: 11/30/2016      PT End of Session - 11/30/16 1636    Visit Number 32   Number of Visits 19   Date for PT Re-Evaluation 12/28/16   Authorization Type 6   Authorization Time Period of 10   PT Start Time 1636   PT Stop Time 1720   PT Time Calculation (min) 44 min   Activity Tolerance Patient tolerated treatment well   Behavior During Therapy [facility] for tasks assessed/performed      Past Medical History:  Diagnosis Date  . Allergy   . GERD (gastroesophageal reflux disease)   . H/O Clostridium difficile infection   . History of palpitations   . Hyperlipidemia   . Migraines   . White coat hypertension     Past Surgical History:  Procedure Laterality Date  . GALLBLADDER SURGERY    . TONSILLECTOMY      There were no vitals filed for this visit.      Subjective Assessment - 11/30/16 1637    Subjective Pt states going back on her hydrochlorothiazide medication. R sciatic is 3-4/10, R knee is 3/10, L knee is 4-5/10 after the last hour (8-9/10 earlier this morning).  Feels pain in the lateral L knee at proximal tibial. Not allergic to electrical pads. No personal history of cancer, no pacemaker.    Pertinent History Bilateral knee pain. Pain began about 4 years ago when pt was going up and down the stairs. Used biofreeze which helped for about a year. 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 Pt states that her knees progressively worsened. Pt also has difficulty going up and down stairs to do laundry due to her knee pain.  Has not had any imaging for her knees.  Pt also states she might have pulled  a muscle behind her R thigh from a balance exercise (standing and leaning back) that she did.    Patient Stated Goals Be able to get out and get to church. Walk better. Pt adds that she wants to get rid of her cane.    Currently in Pain? Yes   Pain Score 5    Pain Onset More than a month ago                                 PT Education - 11/30/16 1650    Education provided Yes   Education Details ther-ex, HEP   Person(s) Educated Patient   Methods Explanation;Demonstration;Tactile cues;Verbal cues;Handout   Comprehension Returned demonstration;Verbalized understanding        There-ex    Seated glute max squeeze with hip adductor ball squeeze 10x5 seconds, for 4 sets to promote VMO muscle use.   Decreased L knee pain (1.5/10 to 2/10) with gait.   Reviewed and given as part of her HEP. Pt demonstrated and verbalized understanding.   Gait around gym with SPC in R 100 ft, and 30 ft. Min cues for increasing hip and knee flexion and step length  Seated thoracic extension over chair 10x2 with 5 second holds to decrease low back pressure  Seated  bilateral shoulder extension with scapular retraction resisting red band 10x5 seconds for 2 sets to promote trunk muscle use.  Chin tucks 10x2 seconds to promote thoracic extension and decrease low back pressure.      Improved exercise technique, movement at target joints, use of target muscles after min to mod verbal, visual, tactile cues.      Manual therapy  STM to R piriformis in S/L  No R sciatic symptoms afterwards     Decreased L knee pain to 1.5/10- to 2/10 after seated hip adductor ball squeeze with glute max squeeze to help activate L VMO muscles and control patella. Continued working on thoracic extension and trunk muscle use to help decrease R sciatic symptoms.                PT Long Term Goals - 11/28/16 1642      PT LONG TERM GOAL #1   Title Patient will have a decrease in  R knee pain to 3/10 or less at worst and L knee pain to 2/10 or less at worst to promote ability to stand up from a chair, ascend and descend stairs, and pick up items from the ground.    Baseline Pain at worst: R knee 7/10, L knee 5/10; 6/10 R knee pain at worst, 5/10 L knee pain at worst (06/29/2016 and 07/27/2016); L knee pain 2/10 at worst,  4.5/10  R knee pain at most for the past 7 days (not counting the morning stiffness) 08/29/2016.    For the past 7 days: 5.5/10 R knee, 4/10 L knee, (not counting the morning stiffness) and 6/10 R gastroc at most (09/13/2016);  For the past 7 days: 6-7/10 R knee, 4-5/10 L knee, 8/10 R gastroc/sciatic pain at most (10/19/2016);  For the past 7 days: 4-5/10 R knee, 3/10 L knee, 4-5/10 R sciatic pain at most for the past 7 days (11/13/2016).    3-4/10 R knee pain, 9/10 L knee pain, 3-4/10 R sciatic pain at most for the past 7 days (11/28/2016)   Time 4   Period Weeks   Status On-going     PT LONG TERM GOAL #2   Title Patient will improve her LEFS score by at least 9 points as a demonstration of improved function.    Baseline 26/80; 29/80 (07/25/2016); 33/80 (08/29/2016); 40/80 (10/19/2016)   Time 6   Period Weeks   Status Achieved     PT LONG TERM GOAL #3   Title Patient will improve bilateral hip abduction and knee flexion strength by at least 1/2 MMT grade to promote ability to negotiate stairs and pick up items from the floor.    Time 6   Period Weeks   Status Achieved     PT LONG TERM GOAL #4   Title Patient will be able to ascend and descend 4 regular steps 3 times with bilateral UE assist with minimal to no complain of knee pain to promote ability to go up and down her stairs at home.    Baseline Increased bilateral knee pain with stair negotiation. Able to ascend and descend 4 regular steps with bilateral UE assist mod I with improved femoral control and without knee pain (after manual therapy). Able to perform today (after hip exercises) with minimal to no  reports of knee pain with reciprocal gait pattern (08/29/2016).  L knee pain currently when weight bearing (11/28/2016)   Time 4   Period Weeks   Status On-going     PT LONG  TERM GOAL #5   Title Patient will be able to ambulate at least 500 ft without LOB and without use of AD.    Baseline Pt currently ambulates with a SPC. Pt able to ambulate up to 300 ft without SPC (CGA) and without LOB 09/13/2016. Currently ambulates with SPC secondary to knee pain (11/28/2016)   Time 4   Period Weeks   Status On-going     Additional Long Term Goals   Additional Long Term Goals Yes     PT LONG TERM GOAL #6   Title Patient will be able to drive to church (pt personal goal).    Baseline Pt currently unable to drive to church due to R sciatic pain.    Time 4   Period Weeks   Status New               Plan - 11/30/16 1718    Clinical Impression Statement Decreased L knee pain to 1.5/10- to 2/10 after seated hip adductor ball squeeze with glute max squeeze to help activate L VMO muscles and control patella. Continued working on thoracic extension and trunk muscle use to help decrease R sciatic symptoms.    Rehab Potential Good   Clinical Impairments Affecting Rehab Potential Chronicity of condition, age   PT Frequency 2x / week   PT Duration 6 weeks   PT Treatment/Interventions Therapeutic exercise;Manual techniques;Aquatic Therapy;Ultrasound;Advice worker;Therapeutic activities;Patient/family education;Neuromuscular re-education;Dry needling   PT Next Visit Plan femoral control, mechanics with stair negotiation, movement, hip strengthening, HEP   Consulted and Agree with Plan of Care Patient      Patient will benefit from skilled therapeutic intervention in order to improve the following deficits and impairments:  Pain, Improper body mechanics, Decreased strength  Visit Diagnosis: Chronic pain of right knee  Difficulty in walking, not elsewhere classified  Muscle  weakness (generalized)  Sciatica, right side  Chronic pain of left knee     Problem List There are no active problems to display for this patient.   Joneen Boers PT, DPT   11/30/2016, 7:08 PM  [place] [facility] [address] [place], [address] Phone: [phone]   Fax:  [phone]  Name: Andrea Hester MRN: [id number] Date of Birth: [birth year]

## 2016-12-05 ENCOUNTER — Ambulatory Visit: Payer: Medicare Other

## 2016-12-05 DIAGNOSIS — M5431 Sciatica, right side: Secondary | ICD-10-CM

## 2016-12-05 DIAGNOSIS — M25562 Pain in left knee: Secondary | ICD-10-CM

## 2016-12-05 DIAGNOSIS — M6281 Muscle weakness (generalized): Secondary | ICD-10-CM

## 2016-12-05 DIAGNOSIS — R262 Difficulty in walking, not elsewhere classified: Secondary | ICD-10-CM

## 2016-12-05 DIAGNOSIS — M25561 Pain in right knee: Secondary | ICD-10-CM | POA: Diagnosis not present

## 2016-12-05 DIAGNOSIS — G8929 Other chronic pain: Secondary | ICD-10-CM

## 2016-12-05 NOTE — Therapy (Signed)
[facility] [address] [place], [address] Phone: [phone]   Fax:  [phone]  Physical Therapy Treatment  Patient Details  Name: Andrea Hester MRN: [id number] Date of Birth: Jun 29, 1933 Referring Provider: Governor Specking III, MD  Encounter Date: 12/05/2016      PT End of Session - 12/05/16 1623    Visit Number 33   Number of Visits 6   Date for PT Re-Evaluation 12/28/16   Authorization Type 7   Authorization Time Period of 10   PT Start Time 1622   PT Stop Time 1712   PT Time Calculation (min) 50 min   Activity Tolerance Patient tolerated treatment well   Behavior During Therapy [facility] for tasks assessed/performed      Past Medical History:  Diagnosis Date  . Allergy   . GERD (gastroesophageal reflux disease)   . H/O Clostridium difficile infection   . History of palpitations   . Hyperlipidemia   . Migraines   . White coat hypertension     Past Surgical History:  Procedure Laterality Date  . GALLBLADDER SURGERY    . TONSILLECTOMY      There were no vitals filed for this visit.      Subjective Assessment - 12/05/16 1624    Subjective L knee is still pretty crummy. Right now its not pretty bad. Worse in the morning. Gets better around 3:30 pm. The seated pillow squeeze exercise helps.  6-7/10 L knee pain currently (9/10 this morning). 4/10 R knee pain currently. 4/10 R sciatic pain currently. Took blood pressure medication today.    Pertinent History Bilateral knee pain. Pain began about 4 years ago when pt was going up and down the stairs. Used biofreeze which helped for about a year. 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 Pt states that her knees progressively worsened. Pt also has difficulty going up and down stairs to do laundry due to her knee pain.  Has not had any imaging for her knees.  Pt also states she might have  pulled a muscle behind her R thigh from a balance exercise (standing and leaning back) that she did.    Patient Stated Goals Be able to get out and get to church. Walk better. Pt adds that she wants to get rid of her cane.    Currently in Pain? Yes   Pain Score 7    Pain Onset More than a month ago                                 PT Education - 12/05/16 1629    Education provided Yes   Education Details ther-ex   [employer]) Educated Patient   Methods Explanation;Demonstration;Tactile cues;Verbal cues   Comprehension Returned demonstration;Verbalized understanding     Objectives   There-ex    Seated glute max squeeze with hip adductor ball squeeze 10x5 seconds, for 4 sets to promote VMO muscle use.        decreased seated  L knee pain  Seated L knee flexion manually resisted targeting medial hamstrings 10x3   Seated bilateral shoulder extension resisting plate 5 for [id number] seconds with scapular retraction to promote thoracic extension   Improved exercise technique, movement at target joints, use of target muscles after mod verbal, visual, tactile cues.      Manual therapy  STM R piriformis muscle     E-stim L VMO (Russian) in sitting. 10 seconds on with L knee extension/ 10 seconds off with rest. 25 mA x 15 min to promote L VMO strengthening. Decreased L knee pain   Added [place] E-stim to help improve L VMO muscle strength to promote better medial patellar glide L knee to hopefully decrease L knee pain. Continued working on STM to R piriformis muscle to help decrease R sciatic symptoms (secondary to pt stating it helps), and added medial L hamstring strengthening to hopefully decrease L knee pain as well. Pt tolerated session well without aggravation of symptoms. Decreased L knee pain after E-stim as well as with seated hip adduction ball squeeze with glute max activation.        PT Long Term Goals - 11/28/16 1642      PT LONG TERM GOAL #1    Title Patient will have a decrease in R knee pain to 3/10 or less at worst and L knee pain to 2/10 or less at worst to promote ability to stand up from a chair, ascend and descend stairs, and pick up items from the ground.    Baseline Pain at worst: R knee 7/10, L knee 5/10; 6/10 R knee pain at worst, 5/10 L knee pain at worst (06/29/2016 and 07/27/2016); L knee pain 2/10 at worst,  4.5/10  R knee pain at most for the past 7 days (not counting the morning stiffness) 08/29/2016.    For the past 7 days: 5.5/10 R knee, 4/10 L knee, (not counting the morning stiffness) and 6/10 R gastroc at most (09/13/2016);  For the past 7 days: 6-7/10 R knee, 4-5/10 L knee, 8/10 R gastroc/sciatic pain at most (10/19/2016);  For the past 7 days: 4-5/10 R knee, 3/10 L knee, 4-5/10 R sciatic pain at most for the past 7 days (11/13/2016).    3-4/10 R knee pain, 9/10 L knee pain, 3-4/10 R sciatic pain at most for the past 7 days (11/28/2016)   Time 4   Period Weeks   Status On-going     PT LONG TERM GOAL #2   Title Patient will improve her LEFS score by at least 9 points as a demonstration of improved function.    Baseline 26/80; 29/80 (07/25/2016); 33/80 (08/29/2016); 40/80 (10/19/2016)   Time 6   Period Weeks   Status Achieved     PT LONG TERM GOAL #3   Title Patient will improve bilateral hip abduction and knee flexion strength by at least 1/2 MMT grade to promote ability to negotiate stairs and pick up items from the floor.    Time 6   Period Weeks   Status Achieved     PT LONG TERM GOAL #4   Title Patient will be able to ascend and descend 4 regular steps 3 times with bilateral UE assist with minimal to no complain of knee pain to promote ability to go up and down her stairs at home.    Baseline Increased bilateral knee pain with stair negotiation. Able to ascend and descend 4 regular steps with bilateral UE assist mod I with improved femoral control and without knee pain (after manual therapy). Able to perform today  (after hip exercises) with minimal to no reports of knee pain with reciprocal gait pattern (08/29/2016).  L knee pain currently when weight bearing (11/28/2016)   Time 4   Period Weeks   Status On-going     PT LONG TERM GOAL #5  Title Patient will be able to ambulate at least 500 ft without LOB and without use of AD.    Baseline Pt currently ambulates with a SPC. Pt able to ambulate up to 300 ft without SPC (CGA) and without LOB 09/13/2016. Currently ambulates with SPC secondary to knee pain (11/28/2016)   Time 4   Period Weeks   Status On-going     Additional Long Term Goals   Additional Long Term Goals Yes     PT LONG TERM GOAL #6   Title Patient will be able to drive to church (pt personal goal).    Baseline Pt currently unable to drive to church due to R sciatic pain.    Time 4   Period Weeks   Status New               Plan - 12/05/16 1629    Clinical Impression Statement Added [place] E-stim to help improve L VMO muscle strength to promote better medial patellar glide L knee to hopefully decrease L knee pain. Continued working on STM to R piriformis muscle to help decrease R sciatic symptoms (secondary to pt stating it helps), and added medial L hamstring strengthening to hopefully decrease L knee pain as well. Pt tolerated session well without aggravation of symptoms. Decreased L knee pain after E-stim as well as with seated hip adduction ball squeeze with glute max activation.    Rehab Potential Good   Clinical Impairments Affecting Rehab Potential Chronicity of condition, age   PT Frequency 2x / week   PT Duration 6 weeks   PT Treatment/Interventions Therapeutic exercise;Manual techniques;Aquatic Therapy;Ultrasound;Advice worker;Therapeutic activities;Patient/family education;Neuromuscular re-education;Dry needling   PT Next Visit Plan femoral control, mechanics with stair negotiation, movement, hip strengthening, HEP   Consulted and Agree with Plan  of Care Patient      Patient will benefit from skilled therapeutic intervention in order to improve the following deficits and impairments:  Pain, Improper body mechanics, Decreased strength  Visit Diagnosis: Chronic pain of right knee  Difficulty in walking, not elsewhere classified  Muscle weakness (generalized)  Chronic pain of left knee  Sciatica, right side     Problem List There are no active problems to display for this patient.   Joneen Boers PT, DPT   12/05/2016, 6:02 PM  [facility] [address], [place], [address] Phone: [phone]   Fax:  [phone]  Name: NONNIE LEBRUN MRN: [id number] Date of Birth: 1933/06/06

## 2016-12-06 ENCOUNTER — Ambulatory Visit: Payer: Medicare Other

## 2016-12-07 ENCOUNTER — Ambulatory Visit: Payer: Medicare Other | Attending: Orthopedic Surgery

## 2016-12-07 DIAGNOSIS — G8929 Other chronic pain: Secondary | ICD-10-CM | POA: Insufficient documentation

## 2016-12-07 DIAGNOSIS — M5431 Sciatica, right side: Secondary | ICD-10-CM | POA: Insufficient documentation

## 2016-12-07 DIAGNOSIS — M25561 Pain in right knee: Secondary | ICD-10-CM | POA: Diagnosis present

## 2016-12-07 DIAGNOSIS — M6281 Muscle weakness (generalized): Secondary | ICD-10-CM | POA: Insufficient documentation

## 2016-12-07 DIAGNOSIS — M25562 Pain in left knee: Secondary | ICD-10-CM | POA: Insufficient documentation

## 2016-12-07 DIAGNOSIS — R262 Difficulty in walking, not elsewhere classified: Secondary | ICD-10-CM | POA: Diagnosis present

## 2016-12-07 NOTE — Patient Instructions (Signed)
     Lying on your back, knees bent:   Open your arms wide to feel your shoulder blade muscles squeeze behind you.   Hold for 5 seconds.    Repeat 10 times   Perform 3 sets daily.

## 2016-12-07 NOTE — Therapy (Signed)
[facility] [address], [place], [address] Phone: [phone]   Fax:  [phone]  Physical Therapy Treatment  Patient Details  Name: Andrea Hester MRN: [id number] Date of Birth: January 13, 1933 Referring Provider: Governor Specking III, MD  Encounter Date: 12/07/2016      PT End of Session - 12/07/16 1635    Visit Number 34   Number of Visits 35   Date for PT Re-Evaluation 12/28/16   Authorization Type 8   Authorization Time Period of 10   PT Start Time 1634   PT Stop Time 1728   PT Time Calculation (min) 54 min   Activity Tolerance Patient tolerated treatment well   Behavior During Therapy [facility] for tasks assessed/performed      Past Medical History:  Diagnosis Date  . Allergy   . GERD (gastroesophageal reflux disease)   . H/O Clostridium difficile infection   . History of palpitations   . Hyperlipidemia   . Migraines   . White coat hypertension     Past Surgical History:  Procedure Laterality Date  . GALLBLADDER SURGERY    . TONSILLECTOMY      There were no vitals filed for this visit.      Subjective Assessment - 12/07/16 1635    Subjective Pt states that her L knee felt better yesterday morning after last session. L knee feels better after using the E-stim. 3/10 L knee, 3-4/10 R knee, R posterior hip (piriformis/sciatic) pain is 3.5/10 currently (centralized to R rear end). Able to drive yesterday for 15 min (longer than usual) and her R sciatic was ok. Church is about 20 min with stops along the way.     Pertinent History Bilateral knee pain. Pain began about 4 years ago when pt was going up and down the stairs. Used biofreeze which helped for about a year. 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 Pt states that her knees progressively worsened. Pt also has difficulty going up and down stairs to do laundry due to her knee pain.  Has  not had any imaging for her knees.  Pt also states she might have pulled a muscle behind her R thigh from a balance exercise (standing and leaning back) that she did.    Patient Stated Goals Be able to get out and get to church. Walk better. Pt adds that she wants to get rid of her cane.    Currently in Pain? Yes   Pain Score 4    Pain Onset More than a month ago                                 PT Education - 12/07/16 1640    Education provided Yes   Education Details thre-ex, HEP   Person(s) Educated Patient   Methods Explanation;Demonstration;Tactile cues;Verbal cues;Handout   Comprehension Returned demonstration;Verbalized understanding        Objectives  Pt adds that she felt muscle discomfort between her shoulder blades after using the shoulder extension machine after  last session which did not last long.    There-ex   Seated glute max squeeze with hip adductor ball squeeze 10x5 seconds, for 4 sets to promote VMO muscle use.   Seated thoracic extension over chair 10x3 with 5 second holds  Supine open books 10x5 seconds for 2 sets  Reviewed  and given as part of her HEP. Pt demonstrated and verbalized understanding.   Sitting with proper posture and abdominal muscle activation: manual perturbation from PT 10 seconds x 5  Improved exercise technique, movement at target joints, use of target muscles after min to mod verbal, visual, tactile cues.      Manual therapy  STM R piriformis muscle   No sciatic or posterior R hip pain afterwards     E-stim L VMO (Russian) in sitting. 10 seconds on with L knee extension/ 10 seconds off with rest. 24 mA x 15 min to promote L VMO strengthening. Decreased L knee pain   Good carry over of decreased L knee pain from last session. Centralized R sciatic symptoms to R posterior hip. Continued working on increasing L VMO muscle activation, thoracic extension and trunk muscle activation to decrease L  knee pain and R sciatic symptoms.        PT Long Term Goals - 11/28/16 1642      PT LONG TERM GOAL #1   Title Patient will have a decrease in R knee pain to 3/10 or less at worst and L knee pain to 2/10 or less at worst to promote ability to stand up from a chair, ascend and descend stairs, and pick up items from the ground.    Baseline Pain at worst: R knee 7/10, L knee 5/10; 6/10 R knee pain at worst, 5/10 L knee pain at worst (06/29/2016 and 07/27/2016); L knee pain 2/10 at worst,  4.5/10  R knee pain at most for the past 7 days (not counting the morning stiffness) 08/29/2016.    For the past 7 days: 5.5/10 R knee, 4/10 L knee, (not counting the morning stiffness) and 6/10 R gastroc at most (09/13/2016);  For the past 7 days: 6-7/10 R knee, 4-5/10 L knee, 8/10 R gastroc/sciatic pain at most (10/19/2016);  For the past 7 days: 4-5/10 R knee, 3/10 L knee, 4-5/10 R sciatic pain at most for the past 7 days (11/13/2016).    3-4/10 R knee pain, 9/10 L knee pain, 3-4/10 R sciatic pain at most for the past 7 days (11/28/2016)   Time 4   Period Weeks   Status On-going     PT LONG TERM GOAL #2   Title Patient will improve her LEFS score by at least 9 points as a demonstration of improved function.    Baseline 26/80; 29/80 (07/25/2016); 33/80 (08/29/2016); 40/80 (10/19/2016)   Time 6   Period Weeks   Status Achieved     PT LONG TERM GOAL #3   Title Patient will improve bilateral hip abduction and knee flexion strength by at least 1/2 MMT grade to promote ability to negotiate stairs and pick up items from the floor.    Time 6   Period Weeks   Status Achieved     PT LONG TERM GOAL #4   Title Patient will be able to ascend and descend 4 regular steps 3 times with bilateral UE assist with minimal to no complain of knee pain to promote ability to go up and down her stairs at home.    Baseline Increased bilateral knee pain with stair negotiation. Able to ascend and descend 4 regular steps with bilateral UE  assist mod I with improved femoral control and without knee pain (after manual therapy). Able to perform today (after hip exercises) with minimal to no reports of knee pain with reciprocal gait pattern (08/29/2016).  L knee pain currently when  weight bearing (11/28/2016)   Time 4   Period Weeks   Status On-going     PT LONG TERM GOAL #5   Title Patient will be able to ambulate at least 500 ft without LOB and without use of AD.    Baseline Pt currently ambulates with a SPC. Pt able to ambulate up to 300 ft without SPC (CGA) and without LOB 09/13/2016. Currently ambulates with SPC secondary to knee pain (11/28/2016)   Time 4   Period Weeks   Status On-going     Additional Long Term Goals   Additional Long Term Goals Yes     PT LONG TERM GOAL #6   Title Patient will be able to drive to church (pt personal goal).    Baseline Pt currently unable to drive to church due to R sciatic pain.    Time 4   Period Weeks   Status New               Plan - 12/07/16 1640    Clinical Impression Statement Good carry over of decreased L knee pain from last session. Centralized R sciatic symptoms to R posterior hip. Continued working on increasing L VMO muscle activation, thoracic extension and trunk muscle activation to decrease L knee pain and R sciatic symptoms.    Rehab Potential Good   Clinical Impairments Affecting Rehab Potential Chronicity of condition, age   PT Frequency 2x / week   PT Duration 6 weeks   PT Treatment/Interventions Therapeutic exercise;Manual techniques;Aquatic Therapy;Ultrasound;Advice worker;Therapeutic activities;Patient/family education;Neuromuscular re-education;Dry needling   PT Next Visit Plan femoral control, mechanics with stair negotiation, movement, hip strengthening, HEP   Consulted and Agree with Plan of Care Patient      Patient will benefit from skilled therapeutic intervention in order to improve the following deficits and impairments:   Pain, Improper body mechanics, Decreased strength  Visit Diagnosis: Chronic pain of left knee  Chronic pain of right knee  Difficulty in walking, not elsewhere classified  Muscle weakness (generalized)  Sciatica, right side     Problem List There are no active problems to display for this patient.    Joneen Boers PT, DPT  12/07/2016, 6:37 PM  [facility] [address] [place], [address] Phone: [phone]   Fax:  [phone]  Name: Andrea Hester MRN: [id number] Date of Birth: 1933-10-08

## 2016-12-11 ENCOUNTER — Ambulatory Visit: Payer: Medicare Other

## 2016-12-11 DIAGNOSIS — M25561 Pain in right knee: Principal | ICD-10-CM

## 2016-12-11 DIAGNOSIS — M6281 Muscle weakness (generalized): Secondary | ICD-10-CM

## 2016-12-11 DIAGNOSIS — M25562 Pain in left knee: Secondary | ICD-10-CM

## 2016-12-11 DIAGNOSIS — R262 Difficulty in walking, not elsewhere classified: Secondary | ICD-10-CM

## 2016-12-11 DIAGNOSIS — G8929 Other chronic pain: Secondary | ICD-10-CM

## 2016-12-11 DIAGNOSIS — M5431 Sciatica, right side: Secondary | ICD-10-CM

## 2016-12-11 NOTE — Therapy (Signed)
[facility] [address], [place], [address] Phone: [phone]   Fax:  [phone]  Physical Therapy Treatment  Patient Details  Name: Andrea Hester MRN: [id number] Date of Birth: [birth year] Referring Provider: Governor Specking III, MD  Encounter Date: 12/11/2016      PT End of Session - 12/11/16 1303    Visit Number 35   Number of Visits 80   Date for PT Re-Evaluation 12/28/16   Authorization Type 9   Authorization Time Period of 10   PT Start Time 1303   PT Stop Time 1354   PT Time Calculation (min) 51 min   Activity Tolerance Patient tolerated treatment well   Behavior During Therapy [facility] for tasks assessed/performed      Past Medical History:  Diagnosis Date  . Allergy   . GERD (gastroesophageal reflux disease)   . H/O Clostridium difficile infection   . History of palpitations   . Hyperlipidemia   . Migraines   . White coat hypertension     Past Surgical History:  Procedure Laterality Date  . GALLBLADDER SURGERY    . TONSILLECTOMY      There were no vitals filed for this visit.      Subjective Assessment - 12/11/16 1304    Subjective L knee is 2/10 (pretty much back to how it was before). Feels pain in the back of her R leg. L knee feels stiff with a little pain. 4.5/10 R knee pain currently (might be stiffness as well).  R sciatic is ok. No pain, maybe a little (posterior R hip) today, the pain seems to be more in the back her R calf (5/10 currently).      Pertinent History Bilateral knee pain. Pain began about 4 years ago when pt was going up and down the stairs. Used biofreeze which helped for about a year. 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 Pt states that her knees progressively worsened. Pt also has difficulty going up and down stairs to do laundry due to her knee pain.  Has not had any imaging for her knees.  Pt also  states she might have pulled a muscle behind her R thigh from a balance exercise (standing and leaning back) that she did.    Patient Stated Goals Be able to get out and get to church. Walk better. Pt adds that she wants to get rid of her cane.    Currently in Pain? Yes   Pain Score 5    Pain Onset More than a month ago                                 PT Education - 12/11/16 1325    Education provided Yes   Education Details ther-ex   [employer]) Educated Patient   Methods Explanation;Demonstration;Tactile cues;Verbal cues   Comprehension Returned demonstration;Verbalized understanding         Objectives  No abnormal swelling, no pain with calf squeeze. Slight warmth compared to L leg. Medial R leg mass/localized edema palpated (pt states working on it with doctors) with slight redness in which pt states that her dermatologist said that it was dermatitis. Her doctor told her to wear a stocking for her R leg which irritated her dermatitis more.  Pt states that she rarely sits during the day.  Manual therapy  STM R piriformis muscle   Decreased R calf pain to 4/10       There-ex   Seated thoracic extension over chair 10x10 seconds to promote thoracic extension  Slight decrease in R gastroc symptoms  Seated trunk flexion using physioball 10x5 seconds forward   Then to the L 5x5 seconds. Posterior R hip pulling sensation   Then to the R 10x5 seconds. No R posterior hip pulling sensation  Seated glute max squeeze with hip adductor ball squeeze 10x10 seconds (increased hold time) to promote VMO muscle use.   Gait with SPC on L 20 ft to help decrease pressure to R knee when walking. Pt able to perform (pt usually uses SPC on R side)    Improved exercise technique, movement at target joints, use of target muscles after min to mod verbal, visual, tactile cues.      E-stim R VMO (Russian) in sitting. 10 seconds on with seated R hip  extension isometrics/ 10 seconds off with rest. 22 mA x 15 min to promote R VMO strengthening. Decreased R  knee pain afterwards   Good carry over of decreased L knee pain. Slight decrease in R gastroc symptoms with manual therapy to decrease R piriformis muscle activation, as well as exercises to decrease pressure to low back. R gastroc discomfort might also be associated with R medial leg dermatitis. Utilized [place] E-stim to promote VMO muscle use for R knee to promote medial glide to patella since manual therapy to medial patella for R knee helped before. Decreased R knee pain after [place] E-stim to R knee to promote VMO muscle use.         PT Long Term Goals - 11/28/16 1642      PT LONG TERM GOAL #1   Title Patient will have a decrease in R knee pain to 3/10 or less at worst and L knee pain to 2/10 or less at worst to promote ability to stand up from a chair, ascend and descend stairs, and pick up items from the ground.    Baseline Pain at worst: R knee 7/10, L knee 5/10; 6/10 R knee pain at worst, 5/10 L knee pain at worst (06/29/2016 and 07/27/2016); L knee pain 2/10 at worst,  4.5/10  R knee pain at most for the past 7 days (not counting the morning stiffness) 08/29/2016.    For the past 7 days: 5.5/10 R knee, 4/10 L knee, (not counting the morning stiffness) and 6/10 R gastroc at most (09/13/2016);  For the past 7 days: 6-7/10 R knee, 4-5/10 L knee, 8/10 R gastroc/sciatic pain at most (10/19/2016);  For the past 7 days: 4-5/10 R knee, 3/10 L knee, 4-5/10 R sciatic pain at most for the past 7 days (11/13/2016).    3-4/10 R knee pain, 9/10 L knee pain, 3-4/10 R sciatic pain at most for the past 7 days (11/28/2016)   Time 4   Period Weeks   Status On-going     PT LONG TERM GOAL #2   Title Patient will improve her LEFS score by at least 9 points as a demonstration of improved function.    Baseline 26/80; 29/80 (07/25/2016); 33/80 (08/29/2016); 40/80 (10/19/2016)   Time 6   Period Weeks    Status Achieved     PT LONG TERM GOAL #3   Title Patient will improve bilateral hip abduction and knee flexion strength by at least 1/2 MMT grade to promote ability to negotiate stairs and pick  up items from the floor.    Time 6   Period Weeks   Status Achieved     PT LONG TERM GOAL #4   Title Patient will be able to ascend and descend 4 regular steps 3 times with bilateral UE assist with minimal to no complain of knee pain to promote ability to go up and down her stairs at home.    Baseline Increased bilateral knee pain with stair negotiation. Able to ascend and descend 4 regular steps with bilateral UE assist mod I with improved femoral control and without knee pain (after manual therapy). Able to perform today (after hip exercises) with minimal to no reports of knee pain with reciprocal gait pattern (08/29/2016).  L knee pain currently when weight bearing (11/28/2016)   Time 4   Period Weeks   Status On-going     PT LONG TERM GOAL #5   Title Patient will be able to ambulate at least 500 ft without LOB and without use of AD.    Baseline Pt currently ambulates with a SPC. Pt able to ambulate up to 300 ft without SPC (CGA) and without LOB 09/13/2016. Currently ambulates with SPC secondary to knee pain (11/28/2016)   Time 4   Period Weeks   Status On-going     Additional Long Term Goals   Additional Long Term Goals Yes     PT LONG TERM GOAL #6   Title Patient will be able to drive to church (pt personal goal).    Baseline Pt currently unable to drive to church due to R sciatic pain.    Time 4   Period Weeks   Status New               Plan - 12/11/16 1303    Clinical Impression Statement Good carry over of decreased L knee pain. Slight decrease in R gastroc symptoms with manual therapy to decrease R piriformis muscle activation, as well as exercises to decrease pressure to low back. R gastroc discomfort might also be associated with R medial leg dermatitis. Utilized [place]  E-stim to promote VMO muscle use for R knee to promote medial glide to patella since manual therapy to medial patella for R knee helped before. Decreased R knee pain after [place] E-stim to R knee to promote VMO muscle use.    Rehab Potential Good   Clinical Impairments Affecting Rehab Potential Chronicity of condition, age   PT Frequency 2x / week   PT Duration 6 weeks   PT Treatment/Interventions Therapeutic exercise;Manual techniques;Aquatic Therapy;Ultrasound;Advice worker;Therapeutic activities;Patient/family education;Neuromuscular re-education;Dry needling   PT Next Visit Plan femoral control, mechanics with stair negotiation, movement, hip strengthening, HEP   Consulted and Agree with Plan of Care Patient      Patient will benefit from skilled therapeutic intervention in order to improve the following deficits and impairments:  Pain, Improper body mechanics, Decreased strength  Visit Diagnosis: Chronic pain of right knee  Difficulty in walking, not elsewhere classified  Muscle weakness (generalized)  Chronic pain of left knee  Sciatica, right side     Problem List There are no active problems to display for this patient.   Joneen Boers PT, DPT  12/11/2016, 2:21 PM  [facility] [address] [place], [address] Phone: [phone]   Fax:  [phone]  Name: LASHEMA SLANSKY MRN: [id number] Date of Birth: 05-04-1933

## 2016-12-12 ENCOUNTER — Ambulatory Visit: Payer: Medicare Other

## 2016-12-12 DIAGNOSIS — M6281 Muscle weakness (generalized): Secondary | ICD-10-CM

## 2016-12-12 DIAGNOSIS — R262 Difficulty in walking, not elsewhere classified: Secondary | ICD-10-CM

## 2016-12-12 DIAGNOSIS — M5431 Sciatica, right side: Secondary | ICD-10-CM

## 2016-12-12 DIAGNOSIS — M25561 Pain in right knee: Principal | ICD-10-CM

## 2016-12-12 DIAGNOSIS — M25562 Pain in left knee: Secondary | ICD-10-CM

## 2016-12-12 DIAGNOSIS — G8929 Other chronic pain: Secondary | ICD-10-CM

## 2016-12-12 NOTE — Therapy (Signed)
[facility] [address] [place], [address] Phone: [phone]   Fax:  [phone]  Physical Therapy Treatment And Progress Report  Patient Details  Name: Andrea Hester MRN: [id number] Date of Birth: 02-11-1933 Referring Provider: Governor Specking III, MD  Encounter Date: 12/12/2016      PT End of Session - 12/12/16 1540    Visit Number 36   Number of Visits 85   Date for PT Re-Evaluation 12/28/16   Authorization Type 1   Authorization Time Period of 10   PT Start Time [id number]   PT Stop Time 1630   PT Time Calculation (min) 49 min   Activity Tolerance Patient tolerated treatment well   Behavior During Therapy [facility] for tasks assessed/performed      Past Medical History:  Diagnosis Date  . Allergy   . GERD (gastroesophageal reflux disease)   . H/O Clostridium difficile infection   . History of palpitations   . Hyperlipidemia   . Migraines   . White coat hypertension     Past Surgical History:  Procedure Laterality Date  . GALLBLADDER SURGERY    . TONSILLECTOMY      There were no vitals filed for this visit.      Subjective Assessment - 12/12/16 1541    Subjective Tried using a stocking at her R leg which helped. 2.5/10 L knee (3/10 at most for the past 7 days), 4/10 R knee currently (4/10 at most for the past 7 days). Feels posterior R thigh discomfort with seated hip adductor pillow squeeze, eases with rest. Some difficulty tightening her R glute max compared to her LE. 4/10 R sciatic (calf) currently (4.5/10 at most for the past 7 days).  The E-stim for the R knee helped.   Has not driven to church yet.    Pertinent History Bilateral knee pain. Pain began about 4 years ago when pt was going up and down the stairs. Used biofreeze which helped for about a year. 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 Pt states that her knees  progressively worsened. Pt also has difficulty going up and down stairs to do laundry due to her knee pain.  Has not had any imaging for her knees.  Pt also states she might have pulled a muscle behind her R thigh from a balance exercise (standing and leaning back) that she did.    Patient Stated Goals Be able to get out and get to church. Walk better. Pt adds that she wants to get rid of her cane.    Currently in Pain? Yes   Pain Score 4    Pain Onset More than a month ago                         Objectives    Manual therapy  STM R piriformis muscle           decreased R calf pain      There-ex  Seated ankle DF/PF 10x each direction. Ankle DF increases R gastroc symptoms  Then just ankle PF 10x2  Seated R knee extension 10x3  Gait around gym without AD 400 ft SBA. LOB x1 at end of 400 ft when turning, min A from PT. No aggravation of symptoms. Had to stop secondary to fatigue.   Seated thoracic extension over chair 10x10 seconds to promote thoracic extension  Improved exercise technique, movement at target joints, use of target muscles after mod verbal, visual, tactile cues.      E-stim R VMO (Russian) in sitting. 10 seconds on with seated R hip extension isometrics/ 10 seconds off with rest. 26mA x 15 min to promote R VMO strengthening.    Pt demonstrates overall decreased bilateral knee, and R sciatic pain with levels stabilizing around 3/10 to 4.5/10 at worst on average. Significant improvement in L knee pain with levels decreasing from 9/10 at worst a few weeks ago to 3/10 at worst currently. Pt able to ambulate up to 400 ft without AD today and had to take a break secondary to being tired. Challenges to progress include chronicity of condition, multiple areas needed for treatment, and R medial leg swelling/dermatitis which may affect R gastroc/sciatic symptoms. Pt still demonstrates bilateral knee and R sciatic pain, difficulty performing  functional tasks, difficulty driving to church (due to R sciatic symptoms) and would benefit from continued skilled physical therapy services to address the aforementioned deficits.          PT Education - 12/12/16 1558    Education provided Yes   Education Details ther-ex   [employer]) Educated Patient   Methods Explanation;Demonstration;Tactile cues;Verbal cues   Comprehension Verbalized understanding;Returned demonstration             PT Long Term Goals - 12/12/16 1900      PT LONG TERM GOAL #1   Title Patient will have a decrease in R knee pain to 3/10 or less at worst and L knee pain to 2/10 or less at worst to promote ability to stand up from a chair, ascend and descend stairs, and pick up items from the ground.    Baseline Pain at worst: R knee 7/10, L knee 5/10; 6/10 R knee pain at worst, 5/10 L knee pain at worst (06/29/2016 and 07/27/2016); L knee pain 2/10 at worst,  4.5/10  R knee pain at most for the past 7 days (not counting the morning stiffness) 08/29/2016.    For the past 7 days: 5.5/10 R knee, 4/10 L knee, (not counting the morning stiffness) and 6/10 R gastroc at most (09/13/2016);  For the past 7 days: 6-7/10 R knee, 4-5/10 L knee, 8/10 R gastroc/sciatic pain at most (10/19/2016);  For the past 7 days: 4-5/10 R knee, 3/10 L knee, 4-5/10 R sciatic pain at most for the past 7 days (11/13/2016).    3-4/10 R knee pain, 9/10 L knee pain, 3-4/10 R sciatic pain at most for the past 7 days (11/28/2016);  4/10 R knee, 3/10 L knee, 4.5/10 R sciatic pain at most for the past 7 days (12/12/2016)   Time 4   Period Weeks   Status On-going     PT LONG TERM GOAL #2   Title Patient will improve her LEFS score by at least 9 points as a demonstration of improved function.    Baseline 26/80; 29/80 (07/25/2016); 33/80 (08/29/2016); 40/80 (10/19/2016)   Time 6   Period Weeks   Status Achieved     PT LONG TERM GOAL #3   Title Patient will improve bilateral hip abduction and knee flexion  strength by at least 1/2 MMT grade to promote ability to negotiate stairs and pick up items from the floor.    Time 6   Period Weeks   Status Achieved     PT LONG TERM GOAL #4   Title Patient will be able to ascend and descend  4 regular steps 3 times with bilateral UE assist with minimal to no complain of knee pain to promote ability to go up and down her stairs at home.    Baseline Increased bilateral knee pain with stair negotiation. Able to ascend and descend 4 regular steps with bilateral UE assist mod I with improved femoral control and without knee pain (after manual therapy). Able to perform today (after hip exercises) with minimal to no reports of knee pain with reciprocal gait pattern (08/29/2016).  L knee pain currently when weight bearing (11/28/2016)   Time 4   Period Weeks   Status Deferred     PT LONG TERM GOAL #5   Title Patient will be able to ambulate at least 500 ft without LOB and without use of AD.    Baseline Pt currently ambulates with a SPC. Pt able to ambulate up to 300 ft without SPC (CGA) and without LOB 09/13/2016. Currently ambulates with SPC secondary to knee pain (11/28/2016). Able to ambulate up to 400 ft today without AD, no increase in knee or sciatic symptoms. Had to stop secondary to fatigue. LOB x 1 at end of 400 ft when turning, min A from PT to recover. (2016/12/21)   Time 4   Period Weeks   Status On-going     PT LONG TERM GOAL #6   Title Patient will be able to drive to church (pt personal goal).    Baseline Pt currently unable to drive to church due to R sciatic pain.    Time 4   Period Weeks   Status On-going               Plan - 2016/12/21 1600    Clinical Impression Statement Pt demonstrates overall decreased bilateral knee, and R sciatic pain with levels stabilizing around 3/10 to 4.5/10 at worst on average. Significant improvement in L knee pain with levels decreasing from 9/10 at worst a few weeks ago to 3/10 at worst currently. Pt able to  ambulate up to 400 ft without AD today and had to take a break secondary to being tired. Challenges to progress include chronicity of condition, multiple areas needed for treatment, and R medial leg swelling/dermatitis which may affect R gastroc/sciatic symptoms. Pt still demonstrates bilateral knee and R sciatic pain, difficulty performing functional tasks, difficulty driving to church (due to R sciatic symptoms) and would benefit from continued skilled physical therapy services to address the aforementioned deficits.    Rehab Potential Good   Clinical Impairments Affecting Rehab Potential Chronicity of condition, age   PT Frequency 2x / week   PT Duration 6 weeks   PT Treatment/Interventions Therapeutic exercise;Manual techniques;Aquatic Therapy;Ultrasound;Advice worker;Therapeutic activities;Patient/family education;Neuromuscular re-education;Dry needling   PT Next Visit Plan femoral control, mechanics with stair negotiation, movement, hip strengthening, HEP   Consulted and Agree with Plan of Care Patient      Patient will benefit from skilled therapeutic intervention in order to improve the following deficits and impairments:  Pain, Improper body mechanics, Decreased strength  Visit Diagnosis: Chronic pain of right knee  Difficulty in walking, not elsewhere classified  Muscle weakness (generalized)  Chronic pain of left knee  Sciatica, right side       G-Codes - 12-21-2016 1909    Functional Assessment Tool Used (Outpatient Only)  clinical presentation, patient interview   Functional Limitation Mobility: Walking and moving around   Mobility: Walking and Moving Around Current Status [id number]) At least 60 percent but less than 80 percent  impaired, limited or restricted   Mobility: Walking and Moving Around Goal Status [phone]) At least 20 percent but less than 40 percent impaired, limited or restricted      Problem List There are no active problems to display  for this patient.  Thank you for your referral.   Joneen Boers PT, DPT   12/12/2016, 7:17 PM  [facility] [address], [place], [address] Phone: [phone]   Fax:  [phone]  Name: Andrea Hester MRN: [id number] Date of Birth: May 07, 1933

## 2016-12-18 ENCOUNTER — Ambulatory Visit: Payer: Medicare Other

## 2016-12-20 ENCOUNTER — Ambulatory Visit: Payer: Medicare Other

## 2016-12-20 DIAGNOSIS — M25562 Pain in left knee: Secondary | ICD-10-CM | POA: Diagnosis not present

## 2016-12-20 DIAGNOSIS — R262 Difficulty in walking, not elsewhere classified: Secondary | ICD-10-CM

## 2016-12-20 DIAGNOSIS — G8929 Other chronic pain: Secondary | ICD-10-CM

## 2016-12-20 DIAGNOSIS — M6281 Muscle weakness (generalized): Secondary | ICD-10-CM

## 2016-12-20 DIAGNOSIS — M25561 Pain in right knee: Principal | ICD-10-CM

## 2016-12-20 DIAGNOSIS — M5431 Sciatica, right side: Secondary | ICD-10-CM

## 2016-12-20 NOTE — Patient Instructions (Signed)
On your back on the bed, with your right leg straight (pillow under leg and knee), left knee bent:   Squeeze your rear end muscles together.   Press your entire right leg and thigh against the bed.    Hold for 5 seconds.    Repeat 10 times   Perform 3 sets daily.

## 2016-12-20 NOTE — Therapy (Signed)
[facility] [address], [place], [address] Phone: [phone]   Fax:  [phone]  Physical Therapy Treatment  Patient Details  Name: Andrea Hester MRN: [id number] Date of Birth: 1933-05-09 Referring Provider: Governor Specking III, MD  Encounter Date: 12/20/2016      PT End of Session - 12/20/16 1521    Visit Number 37   Number of Visits 51   Date for PT Re-Evaluation 12/28/16   Authorization Type 2   Authorization Time Period of 10   PT Start Time 1521   PT Stop Time 1614   PT Time Calculation (min) 53 min   Activity Tolerance Patient tolerated treatment well   Behavior During Therapy [facility] for tasks assessed/performed      Past Medical History:  Diagnosis Date  . Allergy   . GERD (gastroesophageal reflux disease)   . H/O Clostridium difficile infection   . History of palpitations   . Hyperlipidemia   . Migraines   . White coat hypertension     Past Surgical History:  Procedure Laterality Date  . GALLBLADDER SURGERY    . TONSILLECTOMY      There were no vitals filed for this visit.      Subjective Assessment - 12/20/16 1522    Subjective L knee is good, 2/10 currently. R knee is stiff. Feels pain in the back of her R knee (popliteal fossa area) 4.5/10 to 5/10 which makes it difficult for her to bend it.  The sciatic nerve is 3.5/10 at the calf.   Did good for the past week without going to PT. Sunday is when she just felt her symptoms.  The electrical stimulation machine helps.    Pertinent History Bilateral knee pain. Pain began about 4 years ago when pt was going up and down the stairs. Used biofreeze which helped for about a year. 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 Pt states that her knees progressively worsened. Pt also has difficulty going up and down stairs to do laundry due to her knee pain.  Has not had any imaging  for her knees.  Pt also states she might have pulled a muscle behind her R thigh from a balance exercise (standing and leaning back) that she did.    Patient Stated Goals Be able to get out and get to church. Walk better. Pt adds that she wants to get rid of her cane.    Currently in Pain? Yes   Pain Score 5    Pain Onset More than a month ago                                 PT Education - 12/20/16 1529    Education provided Yes   Education Details ther-ex, HEP   Person(s) Educated Patient   Methods Explanation;Demonstration;Tactile cues;Verbal cues;Handout   Comprehension Verbalized understanding;Returned demonstration       Objectives  There-ex  Directed patient with seated hip adductor ball squeeze 10x5 seconds with glute max squeeze to decrease piriformis muscle activation. Slight decrease in posterior R knee symptoms  Supine R hip extension isometrics with leg straight, L LE in hooklying position 10x5 seconds for 2 sets  Decreased posterior R knee and calf pain. More limber  Reviewed and given as part of her HEP. Pt demonstrated and verbalized understanding.  Seated thoracic extension over chair 10x5 seconds    Improved exercise technique, movement at target joints, use of target muscles after min to mod verbal, visual, tactile cues.      Manual therapy   STM R piriformis   Decreased R posterior knee symptoms    E-stim RVMO (Russian) in sitting. 10 seconds on with seated R hip extension isometrics/ 10 seconds off with rest. 19.5 mA x 15 min to promote RVMO strengthening.      Decreased R posterior knee and gastroc symptoms with decreasing piriformis muscle activation and increasing glute max muscle use. Continued use of E-stim to promote VMO muscle use secondary to pt stating it helps her knees. Very slow but steady progress towards decreased bilateral knee and R sciatic pain.          PT Long Term Goals - 12/12/16 1900       PT LONG TERM GOAL #1   Title Patient will have a decrease in R knee pain to 3/10 or less at worst and L knee pain to 2/10 or less at worst to promote ability to stand up from a chair, ascend and descend stairs, and pick up items from the ground.    Baseline Pain at worst: R knee 7/10, L knee 5/10; 6/10 R knee pain at worst, 5/10 L knee pain at worst (06/29/2016 and 07/27/2016); L knee pain 2/10 at worst,  4.5/10  R knee pain at most for the past 7 days (not counting the morning stiffness) 08/29/2016.    For the past 7 days: 5.5/10 R knee, 4/10 L knee, (not counting the morning stiffness) and 6/10 R gastroc at most (09/13/2016);  For the past 7 days: 6-7/10 R knee, 4-5/10 L knee, 8/10 R gastroc/sciatic pain at most (10/19/2016);  For the past 7 days: 4-5/10 R knee, 3/10 L knee, 4-5/10 R sciatic pain at most for the past 7 days (11/13/2016).    3-4/10 R knee pain, 9/10 L knee pain, 3-4/10 R sciatic pain at most for the past 7 days (11/28/2016);  4/10 R knee, 3/10 L knee, 4.5/10 R sciatic pain at most for the past 7 days (12/12/2016)   Time 4   Period Weeks   Status On-going     PT LONG TERM GOAL #2   Title Patient will improve her LEFS score by at least 9 points as a demonstration of improved function.    Baseline 26/80; 29/80 (07/25/2016); 33/80 (08/29/2016); 40/80 (10/19/2016)   Time 6   Period Weeks   Status Achieved     PT LONG TERM GOAL #3   Title Patient will improve bilateral hip abduction and knee flexion strength by at least 1/2 MMT grade to promote ability to negotiate stairs and pick up items from the floor.    Time 6   Period Weeks   Status Achieved     PT LONG TERM GOAL #4   Title Patient will be able to ascend and descend 4 regular steps 3 times with bilateral UE assist with minimal to no complain of knee pain to promote ability to go up and down her stairs at home.    Baseline Increased bilateral knee pain with stair negotiation. Able to ascend and descend 4 regular steps with bilateral UE  assist mod I with improved femoral control and without knee pain (after manual therapy). Able to perform today (after hip exercises) with minimal to no reports of knee pain with reciprocal gait pattern (08/29/2016).  L knee pain currently  when weight bearing (11/28/2016)   Time 4   Period Weeks   Status Deferred     PT LONG TERM GOAL #5   Title Patient will be able to ambulate at least 500 ft without LOB and without use of AD.    Baseline Pt currently ambulates with a SPC. Pt able to ambulate up to 300 ft without SPC (CGA) and without LOB 09/13/2016. Currently ambulates with SPC secondary to knee pain (11/28/2016). Able to ambulate up to 400 ft today without AD, no increase in knee or sciatic symptoms. Had to stop secondary to fatigue. LOB x 1 at end of 400 ft when turning, min A from PT to recover. (12/12/2016)   Time 4   Period Weeks   Status On-going     PT LONG TERM GOAL #6   Title Patient will be able to drive to church (pt personal goal).    Baseline Pt currently unable to drive to church due to R sciatic pain.    Time 4   Period Weeks   Status On-going               Plan - 12/20/16 1530    Clinical Impression Statement Decreased R posterior knee and gastroc symptoms with decreasing piriformis muscle activation and increasing glute max muscle use. Continued use of E-stim to promote VMO muscle use secondary to pt stating it helps her knees. Very slow but steady progress towards decreased bilateral knee and R sciatic pain.    Rehab Potential Good   Clinical Impairments Affecting Rehab Potential Chronicity of condition, age   PT Frequency 2x / week   PT Duration 6 weeks   PT Treatment/Interventions Therapeutic exercise;Manual techniques;Aquatic Therapy;Ultrasound;Advice worker;Therapeutic activities;Patient/family education;Neuromuscular re-education;Dry needling   PT Next Visit Plan femoral control, mechanics with stair negotiation, movement, hip  strengthening, HEP   Consulted and Agree with Plan of Care Patient      Patient will benefit from skilled therapeutic intervention in order to improve the following deficits and impairments:  Pain, Improper body mechanics, Decreased strength  Visit Diagnosis: Chronic pain of right knee  Difficulty in walking, not elsewhere classified  Muscle weakness (generalized)  Chronic pain of left knee  Sciatica, right side     Problem List There are no active problems to display for this patient.  Joneen Boers PT, DPT   12/20/2016, 7:22 PM  [facility] [address], [place], [address] Phone: [phone]   Fax:  [phone]  Name: Andrea Hester MRN: [id number] Date of Birth: 20-Jul-1933

## 2016-12-25 ENCOUNTER — Ambulatory Visit: Payer: Medicare Other

## 2016-12-25 DIAGNOSIS — M25562 Pain in left knee: Secondary | ICD-10-CM | POA: Diagnosis not present

## 2016-12-25 DIAGNOSIS — R262 Difficulty in walking, not elsewhere classified: Secondary | ICD-10-CM

## 2016-12-25 DIAGNOSIS — M25561 Pain in right knee: Secondary | ICD-10-CM

## 2016-12-25 DIAGNOSIS — M5431 Sciatica, right side: Secondary | ICD-10-CM

## 2016-12-25 DIAGNOSIS — M6281 Muscle weakness (generalized): Secondary | ICD-10-CM

## 2016-12-25 DIAGNOSIS — G8929 Other chronic pain: Secondary | ICD-10-CM

## 2016-12-25 NOTE — Patient Instructions (Addendum)
  Pt was recommended to hold off on the supine R hip extension isometrics exercise HEP. Pt demonstrated and verbalized understanding.    Gave supine R single knee to chest 10x5 seconds 3 sessions daily as part of her HEP. Pt demonstrated and verbalized understanding.

## 2016-12-25 NOTE — Therapy (Signed)
[facility] [address], [place], [address] Phone: [phone]   Fax:  [phone]  Physical Therapy Treatment  Patient Details  Name: Andrea Hester MRN: [id number] Date of Birth: Jul 07, 1933 Referring Provider: Governor Specking III, MD  Encounter Date: 12/25/2016      PT End of Session - 12/25/16 1315    Visit Number 38   Number of Visits 36   Date for PT Re-Evaluation 12/28/16   Authorization Type 3   Authorization Time Period of 10   PT Start Time 1315   PT Stop Time 1406   PT Time Calculation (min) 51 min   Activity Tolerance Patient tolerated treatment well   Behavior During Therapy [facility] for tasks assessed/performed      Past Medical History:  Diagnosis Date  . Allergy   . GERD (gastroesophageal reflux disease)   . H/O Clostridium difficile infection   . History of palpitations   . Hyperlipidemia   . Migraines   . White coat hypertension     Past Surgical History:  Procedure Laterality Date  . GALLBLADDER SURGERY    . TONSILLECTOMY      There were no vitals filed for this visit.      Subjective Assessment - 12/25/16 1317    Subjective L knee is a 2/10. It's been pretty much back to where it was (before it bothered her). R knee is stiff. Cant seem to bend it back. The exercise from last time (HEP when she is on her back) seems to bother her leg and R thigh. No R knee joint pain. 4.5/10 (back of knee) sciatic pain currently. The exercises when she squeezes with the pillows seems to help as well as the one where she sits and bends her L knee helps. Bringing her groceries up the stairs bothers her.  Pt states using the rw in the morning initially to take the pressure off her knees, then stops using it when they feel better.   Pt states having 7 steps x 2. Does not have a stair rail for her L hand to go up the second set of 7 steps.    Pertinent History Bilateral knee pain. Pain began about 4  years ago when pt was going up and down the stairs. Used biofreeze which helped for about a year. 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 Pt states that her knees progressively worsened. Pt also has difficulty going up and down stairs to do laundry due to her knee pain.  Has not had any imaging for her knees.  Pt also states she might have pulled a muscle behind her R thigh from a balance exercise (standing and leaning back) that she did.    Patient Stated Goals Be able to get out and get to church. Walk better. Pt adds that she wants to get rid of her cane.    Currently in Pain? Yes   Pain Score 5   4.5/10 R sciatic   Pain Onset More than a month ago                                 PT Education - 12/25/16 1337    Education provided Yes   Education Details ther-ex   [employer]) Educated Patient   Methods Explanation;Demonstration;Tactile cues;Verbal cues   Comprehension Verbalized understanding;Returned demonstration  Objectives  Pt states having 7 steps x 2. Does not have a stair rail for her L hand to go up the second set of 7 steps.    There-ex  Directed patient with gait with glute max squeeze 100 ft x 2. No change in posterior knee symptoms during R LE swing phase  Then gait 100 ft with SPC with naval in. Decreased R postreior knee symptoms   Supine R hip extension isometrics with leg straight, L LE in hooklying position 10x5 seconds. R medial leg burning sensation, eases with rest.   Supine lower trunk rotation 10x3 each side   R posterior hip tightness with supine R trunk rotation Supine lower trunk rotation to the L 10x. Slight R posterior low back and hip pain which eases with rest  Supine single knee to chest 10x5 seconds R LE for 3 sets  Decreased R posterior knee symptoms  Seated trunk flexion isometrics with PT resisting SPC horizontally 10x3 with 5 seconds  Decreased R  posterior knee symptoms  Seated bilateral shoulder extension isometrics hands at thighs 10x5 seconds   Seated R knee flexion AROM 10x2 to decrease stiffness    Improved exercise technique, movement at target joints, use of target muscles after min to mod verbal, visual, tactile cues.     Decreased R posterior knee symptoms with exercises that promote lumbar flexion and abdominal muscle use. 3.5/10 to 4/10 R posterior knee/sciatic symptoms after session.              PT Long Term Goals - 12/12/16 1900      PT LONG TERM GOAL #1   Title Patient will have a decrease in R knee pain to 3/10 or less at worst and L knee pain to 2/10 or less at worst to promote ability to stand up from a chair, ascend and descend stairs, and pick up items from the ground.    Baseline Pain at worst: R knee 7/10, L knee 5/10; 6/10 R knee pain at worst, 5/10 L knee pain at worst (06/29/2016 and 07/27/2016); L knee pain 2/10 at worst,  4.5/10  R knee pain at most for the past 7 days (not counting the morning stiffness) 08/29/2016.    For the past 7 days: 5.5/10 R knee, 4/10 L knee, (not counting the morning stiffness) and 6/10 R gastroc at most (09/13/2016);  For the past 7 days: 6-7/10 R knee, 4-5/10 L knee, 8/10 R gastroc/sciatic pain at most (10/19/2016);  For the past 7 days: 4-5/10 R knee, 3/10 L knee, 4-5/10 R sciatic pain at most for the past 7 days (11/13/2016).    3-4/10 R knee pain, 9/10 L knee pain, 3-4/10 R sciatic pain at most for the past 7 days (11/28/2016);  4/10 R knee, 3/10 L knee, 4.5/10 R sciatic pain at most for the past 7 days (12/12/2016)   Time 4   Period Weeks   Status On-going     PT LONG TERM GOAL #2   Title Patient will improve her LEFS score by at least 9 points as a demonstration of improved function.    Baseline 26/80; 29/80 (07/25/2016); 33/80 (08/29/2016); 40/80 (10/19/2016)   Time 6   Period Weeks   Status Achieved     PT LONG TERM GOAL #3   Title Patient will improve bilateral  hip abduction and knee flexion strength by at least 1/2 MMT grade to promote ability to negotiate stairs and pick up items from the floor.    Time  6   Period Weeks   Status Achieved     PT LONG TERM GOAL #4   Title Patient will be able to ascend and descend 4 regular steps 3 times with bilateral UE assist with minimal to no complain of knee pain to promote ability to go up and down her stairs at home.    Baseline Increased bilateral knee pain with stair negotiation. Able to ascend and descend 4 regular steps with bilateral UE assist mod I with improved femoral control and without knee pain (after manual therapy). Able to perform today (after hip exercises) with minimal to no reports of knee pain with reciprocal gait pattern (08/29/2016).  L knee pain currently when weight bearing (11/28/2016)   Time 4   Period Weeks   Status Deferred     PT LONG TERM GOAL #5   Title Patient will be able to ambulate at least 500 ft without LOB and without use of AD.    Baseline Pt currently ambulates with a SPC. Pt able to ambulate up to 300 ft without SPC (CGA) and without LOB 09/13/2016. Currently ambulates with SPC secondary to knee pain (11/28/2016). Able to ambulate up to 400 ft today without AD, no increase in knee or sciatic symptoms. Had to stop secondary to fatigue. LOB x 1 at end of 400 ft when turning, min A from PT to recover. (12/12/2016)   Time 4   Period Weeks   Status On-going     PT LONG TERM GOAL #6   Title Patient will be able to drive to church (pt personal goal).    Baseline Pt currently unable to drive to church due to R sciatic pain.    Time 4   Period Weeks   Status On-going               Plan - 12/25/16 1341    Clinical Impression Statement Decreased R posterior knee symptoms with exercises that promote lumbar flexion and abdominal muscle use. 3.5/10 to 4/10 R posterior knee/sciatic symptoms after session.    Rehab Potential Good   Clinical Impairments Affecting Rehab  Potential Chronicity of condition, age   PT Frequency 2x / week   PT Duration 6 weeks   PT Treatment/Interventions Therapeutic exercise;Manual techniques;Aquatic Therapy;Ultrasound;Advice worker;Therapeutic activities;Patient/family education;Neuromuscular re-education;Dry needling   PT Next Visit Plan femoral control, mechanics with stair negotiation, movement, hip strengthening, HEP   Consulted and Agree with Plan of Care Patient      Patient will benefit from skilled therapeutic intervention in order to improve the following deficits and impairments:  Pain, Improper body mechanics, Decreased strength  Visit Diagnosis: Sciatica, right side  Chronic pain of right knee  Difficulty in walking, not elsewhere classified  Muscle weakness (generalized)  Chronic pain of left knee     Problem List There are no active problems to display for this patient.   Joneen Boers PT, DPT   12/25/2016, 6:58 PM  [facility] [address], [place], [address] Phone: [phone]   Fax:  [phone]  Name: Andrea Hester MRN: [id number] Date of Birth: 11-Sep-1933

## 2016-12-27 ENCOUNTER — Ambulatory Visit: Payer: Medicare Other

## 2017-01-01 ENCOUNTER — Ambulatory Visit: Payer: Medicare Other

## 2017-01-01 DIAGNOSIS — M5431 Sciatica, right side: Secondary | ICD-10-CM

## 2017-01-01 DIAGNOSIS — M25562 Pain in left knee: Secondary | ICD-10-CM | POA: Diagnosis not present

## 2017-01-01 DIAGNOSIS — M25561 Pain in right knee: Secondary | ICD-10-CM

## 2017-01-01 DIAGNOSIS — G8929 Other chronic pain: Secondary | ICD-10-CM

## 2017-01-01 DIAGNOSIS — R262 Difficulty in walking, not elsewhere classified: Secondary | ICD-10-CM

## 2017-01-01 DIAGNOSIS — M6281 Muscle weakness (generalized): Secondary | ICD-10-CM

## 2017-01-01 NOTE — Patient Instructions (Signed)
  Seated bilateral shoulder extension isometrics 10x3 with 5 second holds daily reviewed and given as part of her HEP. Pt demonstrated and verbalize understanding. Handout provided.    Reviewed and gave seated trunk flexion 10x3 with 5 seconds as well as to the L side 10x3 with 5 seconds as part of her HEP. Pt demonstrated and verbalized understanding.

## 2017-01-01 NOTE — Therapy (Signed)
[place] [facility] [address], [place], [address] Phone: [phone]   Fax:  [phone]  Physical Therapy Treatment  Patient Details  Name: Andrea Hester MRN: [id number] Date of Birth: 09-05-1933 Referring Provider: Governor Specking III, MD  Encounter Date: 01/01/2017      PT End of Session - 01/01/17 1430    Visit Number 39   Number of Visits 57   Date for PT Re-Evaluation 02/15/17   Authorization Type 4   Authorization Time Period of 10   PT Start Time 1430   PT Stop Time 1517   PT Time Calculation (min) 47 min   Activity Tolerance Patient tolerated treatment well   Behavior During Therapy [facility] for tasks assessed/performed      Past Medical History:  Diagnosis Date  . Allergy   . GERD (gastroesophageal reflux disease)   . H/O Clostridium difficile infection   . History of palpitations   . Hyperlipidemia   . Migraines   . White coat hypertension     Past Surgical History:  Procedure Laterality Date  . GALLBLADDER SURGERY    . TONSILLECTOMY      There were no vitals filed for this visit.      Subjective Assessment - 01/01/17 1432    Subjective L knee is 1/10 (2.5/10 at most for the past 7 days) currently, R knee is very stiff right now (3.5/10 R knee pain at most after the morning stiffness for the past 7 days). The sciatic nerve feels like more to the side of her R leg (L5/S1 dermatome). Has not had proplems behind her R thigh and posterior hip. 3.5/10  R sciactic pain current and at most for the past 7 days.   Doing her exercise before going to bed helps with her morning stiffness. Does not hurt as much in the morning.  Has not yet been able to drive to church because it's crowded.  Pt states wanting to try working on her HEP with follow up visits.    Pertinent History Bilateral knee pain. Pain began about 4 years ago when pt was going up and down the stairs. Used biofreeze which helped for  about a year. 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 Pt states that her knees progressively worsened. Pt also has difficulty going up and down stairs to do laundry due to her knee pain.  Has not had any imaging for her knees.  Pt also states she might have pulled a muscle behind her R thigh from a balance exercise (standing and leaning back) that she did.    Patient Stated Goals Be able to get out and get to church. Walk better. Pt adds that she wants to get rid of her cane.    Currently in Pain? Yes   Pain Score 3   3.5/10   Pain Onset More than a month ago                                 PT Education - 01/01/17 1445    Education provided Yes   Education Details ther-ex, plan of care 1x/1-2 weeks for 6 weeks   Person(s) Educated Patient   Methods Explanation;Demonstration;Verbal cues;Tactile cues   Comprehension Verbalized understanding;Returned demonstration        Objectives  There-ex  Reviewed plan of care: 1x/1-2 weeks  for 6 weeks with pt working  on her HEP.   Seated bilateral shoulder extension isometrics 10x3 with 5 second holds  Reviewed and given as part of her HEP. Pt demonstrated and verbalize understanding. Handout provided.   Standing wall push-ups 5x3 to promote trunk muscle use (similar to standing plank position). Bilateral knee discomfort. Eases with sitting rest  Seated trunk flexion isometrics with PT resisting SPC horizontally 10x3 with 5 seconds             Decreased R posterior knee symptoms  Seated trunk flexion while rolling a physioball 10x5 seconds forward for 2 sets   10x2 with 5 seconds L side    Decreased R sciatic pain to 2.5/10 to 3/10    Reviewed and given as part of her HEP 3x10 with 5 seconds. Can perform without physioball but with seated trunk flexion forward as well as to the L. Pt demonstrated and verbalized undrstanding.   Ascending and descending  4 regular steps with bilateral UE assist. 1x. Bilateral knee pain R > L   Improved exercise technique, movement at target joints, use of target muscles after min to mod verbal, visual, tactile cues.   Pt demonstrates good carry over of decreased bilateral knee and R sciatic pain levels from last session last week. Symptoms improve with trunk flexion, trunk, glute, and VMO strengthening and knee ROM. Pt is consistent with HEP. Pt to try her HEP and go to follow up visits 1x every 1-2 weeks for 6 weeks to see how her symptoms do with her home program. Pt still demonstrates bilateral knee pain and sciatic pain, and difficulty performing functional tasks such as stair negotiation and driving to church and would benefit from continued skilled physical therapy services to address the afroementiuoned deficits.              PT Long Term Goals - 01/01/17 1447      PT LONG TERM GOAL #1   Title Patient will have a decrease in R knee pain to 3/10 or less at worst and L knee pain to 2/10 or less at worst to promote ability to stand up from a chair, ascend and descend stairs, and pick up items from the ground.    Baseline Pain at worst: R knee 7/10, L knee 5/10; 6/10 R knee pain at worst, 5/10 L knee pain at worst (06/29/2016 and 07/27/2016); L knee pain 2/10 at worst,  4.5/10  R knee pain at most for the past 7 days (not counting the morning stiffness) 08/29/2016.    For the past 7 days: 5.5/10 R knee, 4/10 L knee, (not counting the morning stiffness) and 6/10 R gastroc at most (09/13/2016);  For the past 7 days: 6-7/10 R knee, 4-5/10 L knee, 8/10 R gastroc/sciatic pain at most (10/19/2016);  For the past 7 days: 4-5/10 R knee, 3/10 L knee, 4-5/10 R sciatic pain at most for the past 7 days (11/13/2016).    3-4/10 R knee pain, 9/10 L knee pain, 3-4/10 R sciatic pain at most for the past 7 days (11/28/2016);  4/10 R knee, 3/10 L knee, 4.5/10 R sciatic pain at most for the past 7 days (12/12/2016);   Pain at worst  after morning stiffness: R 3.5/10, L knee 2.5/10, R sciatic 3.5/10 (01/01/2017)   Time 6   Period Weeks   Status On-going     PT LONG TERM GOAL #2   Title Patient will improve her LEFS score by at least 9 points  as a demonstration of improved function.    Baseline 26/80; 29/80 (07/25/2016); 33/80 (08/29/2016); 40/80 (10/19/2016)   Time 6   Period Weeks   Status Achieved     PT LONG TERM GOAL #3   Title Patient will improve bilateral hip abduction and knee flexion strength by at least 1/2 MMT grade to promote ability to negotiate stairs and pick up items from the floor.    Time 6   Period Weeks   Status Achieved     PT LONG TERM GOAL #4   Title Patient will be able to ascend and descend 4 regular steps 3 times with bilateral UE assist with minimal to no complain of knee pain to promote ability to go up and down her stairs at home.    Baseline Increased bilateral knee pain with stair negotiation. Able to ascend and descend 4 regular steps with bilateral UE assist mod I with improved femoral control and without knee pain (after manual therapy). Able to perform today (after hip exercises) with minimal to no reports of knee pain with reciprocal gait pattern (08/29/2016).  L knee pain currently when weight bearing (11/28/2016). Bilateral knee pain R > L (01/01/2017)   Time 6   Period Weeks   Status On-going     PT LONG TERM GOAL #5   Title Patient will be able to ambulate at least 500 ft without LOB and without use of AD.    Baseline Pt currently ambulates with a SPC. Pt able to ambulate up to 300 ft without SPC (CGA) and without LOB 09/13/2016. Currently ambulates with SPC secondary to knee pain (11/28/2016). Able to ambulate up to 400 ft today without AD, no increase in knee or sciatic symptoms. Had to stop secondary to fatigue. LOB x 1 at end of 400 ft when turning, min A from PT to recover. (12/12/2016)   Time 4   Period Weeks   Status On-going     PT LONG TERM GOAL #6   Title Patient will be  able to drive to church (pt personal goal).    Baseline Pt currently unable to drive to church due to R sciatic pain.    Time 6   Period Weeks   Status On-going               Plan - 01/01/17 1429    Clinical Impression Statement Pt demonstrates good carry over of decreased bilateral knee and R sciatic pain levels from last session last week. Symptoms improve with trunk flexion, trunk, glute, and VMO strengthening and knee ROM. Pt is consistent with HEP. Pt to try her HEP and go to follow up visits 1x every 1-2 weeks for 6 weeks to see how her symptoms do with her home program. Pt still demonstrates bilateral knee pain and sciatic pain, and difficulty performing functional tasks such as stair negotiation and driving to church and would benefit from continued skilled physical therapy services to address the afroementiuoned deficits.    Rehab Potential Good   Clinical Impairments Affecting Rehab Potential Chronicity of condition, age   PT Frequency 1x / week  1x/1-2 weeks   PT Duration 6 weeks   PT Treatment/Interventions Therapeutic exercise;Manual techniques;Aquatic Therapy;Ultrasound;Advice worker;Therapeutic activities;Patient/family education;Neuromuscular re-education;Dry needling   PT Next Visit Plan femoral control, mechanics with stair negotiation, movement, hip strengthening, HEP   Consulted and Agree with Plan of Care Patient      Patient will benefit from skilled therapeutic intervention in order to improve the following  deficits and impairments:  Pain, Improper body mechanics, Decreased strength  Visit Diagnosis: Sciatica, right side - Plan: PT plan of care cert/re-cert  Chronic pain of right knee - Plan: PT plan of care cert/re-cert  Difficulty in walking, not elsewhere classified - Plan: PT plan of care cert/re-cert  Muscle weakness (generalized) - Plan: PT plan of care cert/re-cert  Chronic pain of left knee - Plan: PT plan of care  cert/re-cert     Problem List There are no active problems to display for this patient.  Joneen Boers PT, DPT   01/01/2017, 6:57 PM  [place] [facility] [address], [place], [address] Phone: [phone]   Fax:  [phone]  Name: Andrea Hester MRN: [id number] Date of Birth: 07-29-1933

## 2017-01-03 ENCOUNTER — Ambulatory Visit: Payer: Medicare Other

## 2017-01-11 ENCOUNTER — Ambulatory Visit: Payer: Medicare Other | Attending: Orthopedic Surgery

## 2017-01-11 DIAGNOSIS — R262 Difficulty in walking, not elsewhere classified: Secondary | ICD-10-CM | POA: Insufficient documentation

## 2017-01-11 DIAGNOSIS — M6281 Muscle weakness (generalized): Secondary | ICD-10-CM | POA: Diagnosis present

## 2017-01-11 DIAGNOSIS — M25561 Pain in right knee: Secondary | ICD-10-CM | POA: Insufficient documentation

## 2017-01-11 DIAGNOSIS — M25562 Pain in left knee: Secondary | ICD-10-CM | POA: Insufficient documentation

## 2017-01-11 DIAGNOSIS — G8929 Other chronic pain: Secondary | ICD-10-CM | POA: Diagnosis present

## 2017-01-11 DIAGNOSIS — M5431 Sciatica, right side: Secondary | ICD-10-CM | POA: Insufficient documentation

## 2017-01-11 NOTE — Patient Instructions (Addendum)
Gave seated palloff press straight resisting red band 10x3 with 5 seconds daily as part of her HEP. Pt demonstrated and verbalized understanding. Handout provided.

## 2017-01-11 NOTE — Therapy (Signed)
[place] [facility] [address] [place], [address] Phone: [phone]   Fax:  [phone]  Physical Therapy Treatment  Patient Details  Name: Andrea Hester MRN: [id number] Date of Birth: Apr 28, 1933 Referring Provider: Governor Specking III, MD  Encounter Date: 01/11/2017      PT End of Session - 01/11/17 1550    Visit Number 40   Number of Visits 51   Date for PT Re-Evaluation 02/15/17   Authorization Type 5   Authorization Time Period of 10   PT Start Time 3903   PT Stop Time 1639   PT Time Calculation (min) 49 min   Activity Tolerance Patient tolerated treatment well   Behavior During Therapy [facility] for tasks assessed/performed      Past Medical History:  Diagnosis Date  . Allergy   . GERD (gastroesophageal reflux disease)   . H/O Clostridium difficile infection   . History of palpitations   . Hyperlipidemia   . Migraines   . White coat hypertension     Past Surgical History:  Procedure Laterality Date  . GALLBLADDER SURGERY    . TONSILLECTOMY      There were no vitals filed for this visit.      Subjective Assessment - 01/11/17 1552    Subjective Patient states that her sciatic pain has been bothering her a lot since around [place] weekend.    Pertinent History Bilateral knee pain. Pain began about 4 years ago when pt was going up and down the stairs. Used biofreeze which helped for about a year. 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 Pt states that her knees progressively worsened. Pt also has difficulty going up and down stairs to do laundry due to her knee pain.  Has not had any imaging for her knees.  Pt also states she might have pulled a muscle behind her R thigh from a balance exercise (standing and leaning back) that she did.    Patient Stated Goals Be able to get out and get to church. Walk better. Pt adds that she wants to get  rid of her cane.    Currently in Pain? Yes   Pain Score 8   7-8/10 R sciatic pain   Pain Onset More than a month ago                                 PT Education - 01/11/17 2039    Education provided Yes   Education Details ther-ex, HEP   Person(s) Educated Patient   Methods Explanation;Demonstration;Tactile cues;Verbal cues;Handout   Comprehension Verbalized understanding;Returned demonstration        Objectives  There-ex   Directed patient with seated trunk flexion 2x5 with 10 second holds. Decreased L gastroc pain  Then another 2x5 with 10 second holds aferwards Seated hip adduction ball squeeze 10x2 with 10 second holds Seated pallof press (straight) resisting red band 10x3 with 5 second holds   Decreased symptoms.  Seated low rows resisting red band 8x5 seconds for 2 sets  Decreased R posterior hip and knee pain Seated glute max squeeze x 1 min. No change in calf symptoms Seated thoracic extension over chair 10x5 seconds for 2 sets. Decreased symptoms   Improved exercise technique, movement at target joints, use of target muscles after min to mod verbal, visual, tactile cues.  Decreased  R sciatic pain to 4-5/10 after session. Decreased R sciatic symptoms with exercises to promote thoracic extension, increase intervertebral foraminal space, decrease R piriformis muscle activation, increase trunk muscle activation.        PT Long Term Goals - 01/01/17 1447      PT LONG TERM GOAL #1   Title Patient will have a decrease in R knee pain to 3/10 or less at worst and L knee pain to 2/10 or less at worst to promote ability to stand up from a chair, ascend and descend stairs, and pick up items from the ground.    Baseline Pain at worst: R knee 7/10, L knee 5/10; 6/10 R knee pain at worst, 5/10 L knee pain at worst (06/29/2016 and 07/27/2016); L knee pain 2/10 at worst,  4.5/10  R knee pain at most for the past 7 days (not counting the morning  stiffness) 08/29/2016.    For the past 7 days: 5.5/10 R knee, 4/10 L knee, (not counting the morning stiffness) and 6/10 R gastroc at most (09/13/2016);  For the past 7 days: 6-7/10 R knee, 4-5/10 L knee, 8/10 R gastroc/sciatic pain at most (10/19/2016);  For the past 7 days: 4-5/10 R knee, 3/10 L knee, 4-5/10 R sciatic pain at most for the past 7 days (11/13/2016).    3-4/10 R knee pain, 9/10 L knee pain, 3-4/10 R sciatic pain at most for the past 7 days (11/28/2016);  4/10 R knee, 3/10 L knee, 4.5/10 R sciatic pain at most for the past 7 days (12/12/2016);   Pain at worst after morning stiffness: R 3.5/10, L knee 2.5/10, R sciatic 3.5/10 (01/01/2017)   Time 6   Period Weeks   Status On-going     PT LONG TERM GOAL #2   Title Patient will improve her LEFS score by at least 9 points as a demonstration of improved function.    Baseline 26/80; 29/80 (07/25/2016); 33/80 (08/29/2016); 40/80 (10/19/2016)   Time 6   Period Weeks   Status Achieved     PT LONG TERM GOAL #3   Title Patient will improve bilateral hip abduction and knee flexion strength by at least 1/2 MMT grade to promote ability to negotiate stairs and pick up items from the floor.    Time 6   Period Weeks   Status Achieved     PT LONG TERM GOAL #4   Title Patient will be able to ascend and descend 4 regular steps 3 times with bilateral UE assist with minimal to no complain of knee pain to promote ability to go up and down her stairs at home.    Baseline Increased bilateral knee pain with stair negotiation. Able to ascend and descend 4 regular steps with bilateral UE assist mod I with improved femoral control and without knee pain (after manual therapy). Able to perform today (after hip exercises) with minimal to no reports of knee pain with reciprocal gait pattern (08/29/2016).  L knee pain currently when weight bearing (11/28/2016). Bilateral knee pain R > L (01/01/2017)   Time 6   Period Weeks   Status On-going     PT LONG TERM GOAL #5    Title Patient will be able to ambulate at least 500 ft without LOB and without use of AD.    Baseline Pt currently ambulates with a SPC. Pt able to ambulate up to 300 ft without SPC (CGA) and without LOB 09/13/2016. Currently ambulates with SPC secondary to knee pain (  11/28/2016). Able to ambulate up to 400 ft today without AD, no increase in knee or sciatic symptoms. Had to stop secondary to fatigue. LOB x 1 at end of 400 ft when turning, min A from PT to recover. (12/12/2016)   Time 4   Period Weeks   Status On-going     PT LONG TERM GOAL #6   Title Patient will be able to drive to church (pt personal goal).    Baseline Pt currently unable to drive to church due to R sciatic pain.    Time 6   Period Weeks   Status On-going               Plan - 01/11/17 2044    Clinical Impression Statement Decreased  R sciatic pain to 4-5/10 after session. Decreased R sciatic symptoms with exercises to promote thoracic extension, increase intervertebral foraminal space, decrease R piriformis muscle activation, increase trunk muscle activation.   Rehab Potential Good   Clinical Impairments Affecting Rehab Potential Chronicity of condition, age   PT Frequency 1x / week  1x/1-2 weeks   PT Duration 6 weeks   PT Treatment/Interventions Therapeutic exercise;Manual techniques;Aquatic Therapy;Ultrasound;Advice worker;Therapeutic activities;Patient/family education;Neuromuscular re-education;Dry needling   PT Next Visit Plan femoral control, mechanics with stair negotiation, movement, hip strengthening, HEP   Consulted and Agree with Plan of Care Patient      Patient will benefit from skilled therapeutic intervention in order to improve the following deficits and impairments:  Pain, Improper body mechanics, Decreased strength  Visit Diagnosis: Sciatica, right side  Difficulty in walking, not elsewhere classified  Muscle weakness (generalized)     Problem List There are no  active problems to display for this patient.   Joneen Boers PT, DPT  01/11/2017, 8:47 PM  [place] [facility] [address] [place], [address] Phone: [phone]   Fax:  [phone]  Name: Andrea Hester MRN: [id number] Date of Birth: June 20, 1933

## 2017-01-17 ENCOUNTER — Ambulatory Visit: Payer: Medicare Other

## 2017-01-17 DIAGNOSIS — M5431 Sciatica, right side: Secondary | ICD-10-CM | POA: Diagnosis not present

## 2017-01-17 DIAGNOSIS — R262 Difficulty in walking, not elsewhere classified: Secondary | ICD-10-CM

## 2017-01-17 DIAGNOSIS — M6281 Muscle weakness (generalized): Secondary | ICD-10-CM

## 2017-01-17 NOTE — Therapy (Signed)
[facility] [address], [place], [address] Phone: [phone]   Fax:  [phone]  Physical Therapy Treatment  Patient Details  Name: Andrea Hester MRN: [id number] Date of Birth: [birth year] Referring Provider: Governor Specking III, MD  Encounter Date: 01/17/2017      PT End of Session - 01/17/17 1449    Visit Number 41   Number of Visits 6   Date for PT Re-Evaluation 02/15/17   Authorization Type 6   Authorization Time Period of 10   PT Start Time 1449   PT Stop Time 1531   PT Time Calculation (min) 42 min   Activity Tolerance Patient tolerated treatment well   Behavior During Therapy [facility] for tasks assessed/performed      Past Medical History:  Diagnosis Date  . Allergy   . GERD (gastroesophageal reflux disease)   . H/O Clostridium difficile infection   . History of palpitations   . Hyperlipidemia   . Migraines   . White coat hypertension     Past Surgical History:  Procedure Laterality Date  . GALLBLADDER SURGERY    . TONSILLECTOMY      There were no vitals filed for this visit.      Subjective Assessment - 01/17/17 1451    Subjective L leg is pretty good. Has R posterior knee and calf pain. Besides the knee stiffness, R knee is not bad.  Might have done herself in when she went to church Easter Sunday with a lot of standing up and sitting down.  5-6/10 R sciatic pain.  Getting up after sitting for a while bothers her sciatic symptoms and that is why she does not like sitting on a chair.  The seated pallof press straight exercise  bothered her shoulders and neck so she stopped doing them.    Pertinent History Bilateral knee pain. Pain began about 4 years ago when pt was going up and down the stairs. Used biofreeze which helped for about a year. 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 Pt states that her knees progressively  worsened. Pt also has difficulty going up and down stairs to do laundry due to her knee pain.  Has not had any imaging for her knees.  Pt also states she might have pulled a muscle behind her R thigh from a balance exercise (standing and leaning back) that she did.    Patient Stated Goals Be able to get out and get to church. Walk better. Pt adds that she wants to get rid of her cane.    Currently in Pain? Yes   Pain Score 6    Pain Onset More than a month ago                                 PT Education - 01/17/17 1532    Education provided Yes   Education Details ther-ex, HEP   Person(s) Educated Patient   Methods Explanation;Demonstration;Tactile cues;Verbal cues;Handout   Comprehension Verbalized understanding;Returned demonstration        Objective  Standing posture: R lumbar side bending  There-ex  Seated R and L trunk rotation 10x 2 seconds for 3 sets   Slight decrease in sciatic symptoms    Walking with rw to decrease pressure to R low back during R LE stance phase 200 ft with PT assist  to decrease L lateral lean during R LE stance phase  R sciatic symptoms not as bad  Sitting with pillow under L hip to improve R intervertebral foraminal space  SLS on R LE with L tip toe assist with emphasis on level pelvis 10x5 seconds then 5x5 seconds to promote pelvic and trunk control during R LE stance phase in gait  Standing L shoulder adduction resisting yellow band 10x2 with 5 second holds  To decrease R lateral lean      Improved exercise technique, movement at target joints, use of target muscles after mod verbal, visual, tactile cues.       Difficulty with carry over. Decreased R sciatic pain with exercises to promote increase in R lumbar intervertebral foraminal space. Decreased symptoms to 3.5 to 4/10 (sciatic) after session.        PT Long Term Goals - 01/01/17 1447      PT LONG TERM GOAL #1   Title Patient will have a decrease  in R knee pain to 3/10 or less at worst and L knee pain to 2/10 or less at worst to promote ability to stand up from a chair, ascend and descend stairs, and pick up items from the ground.    Baseline Pain at worst: R knee 7/10, L knee 5/10; 6/10 R knee pain at worst, 5/10 L knee pain at worst (06/29/2016 and 07/27/2016); L knee pain 2/10 at worst,  4.5/10  R knee pain at most for the past 7 days (not counting the morning stiffness) 08/29/2016.    For the past 7 days: 5.5/10 R knee, 4/10 L knee, (not counting the morning stiffness) and 6/10 R gastroc at most (09/13/2016);  For the past 7 days: 6-7/10 R knee, 4-5/10 L knee, 8/10 R gastroc/sciatic pain at most (10/19/2016);  For the past 7 days: 4-5/10 R knee, 3/10 L knee, 4-5/10 R sciatic pain at most for the past 7 days (11/13/2016).    3-4/10 R knee pain, 9/10 L knee pain, 3-4/10 R sciatic pain at most for the past 7 days (11/28/2016);  4/10 R knee, 3/10 L knee, 4.5/10 R sciatic pain at most for the past 7 days (12/12/2016);   Pain at worst after morning stiffness: R 3.5/10, L knee 2.5/10, R sciatic 3.5/10 (01/01/2017)   Time 6   Period Weeks   Status On-going     PT LONG TERM GOAL #2   Title Patient will improve her LEFS score by at least 9 points as a demonstration of improved function.    Baseline 26/80; 29/80 (07/25/2016); 33/80 (08/29/2016); 40/80 (10/19/2016)   Time 6   Period Weeks   Status Achieved     PT LONG TERM GOAL #3   Title Patient will improve bilateral hip abduction and knee flexion strength by at least 1/2 MMT grade to promote ability to negotiate stairs and pick up items from the floor.    Time 6   Period Weeks   Status Achieved     PT LONG TERM GOAL #4   Title Patient will be able to ascend and descend 4 regular steps 3 times with bilateral UE assist with minimal to no complain of knee pain to promote ability to go up and down her stairs at home.    Baseline Increased bilateral knee pain with stair negotiation. Able to ascend and  descend 4 regular steps with bilateral UE assist mod I with improved femoral control and without knee pain (after manual therapy). Able to perform today (  after hip exercises) with minimal to no reports of knee pain with reciprocal gait pattern (08/29/2016).  L knee pain currently when weight bearing (11/28/2016). Bilateral knee pain R > L (01/01/2017)   Time 6   Period Weeks   Status On-going     PT LONG TERM GOAL #5   Title Patient will be able to ambulate at least 500 ft without LOB and without use of AD.    Baseline Pt currently ambulates with a SPC. Pt able to ambulate up to 300 ft without SPC (CGA) and without LOB 09/13/2016. Currently ambulates with SPC secondary to knee pain (11/28/2016). Able to ambulate up to 400 ft today without AD, no increase in knee or sciatic symptoms. Had to stop secondary to fatigue. LOB x 1 at end of 400 ft when turning, min A from PT to recover. (12/12/2016)   Time 4   Period Weeks   Status On-going     PT LONG TERM GOAL #6   Title Patient will be able to drive to church (pt personal goal).    Baseline Pt currently unable to drive to church due to R sciatic pain.    Time 6   Period Weeks   Status On-going               Plan - 01/17/17 1535    Clinical Impression Statement Difficulty with carry over. Decreased R sciatic pain with exercises to promote increase in R lumbar intervertebral foraminal space. Decreased symptoms to 3.5 to 4/10 (sciatic) after session.   Rehab Potential Good   Clinical Impairments Affecting Rehab Potential Chronicity of condition, age   PT Frequency 1x / week  1x/1-2 weeks   PT Duration 6 weeks   PT Treatment/Interventions Therapeutic exercise;Manual techniques;Aquatic Therapy;Ultrasound;Advice worker;Therapeutic activities;Patient/family education;Neuromuscular re-education;Dry needling   PT Next Visit Plan femoral control, mechanics with stair negotiation, movement, hip strengthening, HEP   Consulted  and Agree with Plan of Care Patient      Patient will benefit from skilled therapeutic intervention in order to improve the following deficits and impairments:  Pain, Improper body mechanics, Decreased strength  Visit Diagnosis: Sciatica, right side  Difficulty in walking, not elsewhere classified  Muscle weakness (generalized)     Problem List There are no active problems to display for this patient.   Joneen Boers PT, DPT   01/17/2017, 6:10 PM  [facility] [address] [place], [address] Phone: [phone]   Fax:  [phone]  Name: Andrea Hester MRN: [id number] Date of Birth: [birth year]

## 2017-01-17 NOTE — Patient Instructions (Addendum)
   Hold yellow band in left hand, arm out. Pull arm to your side. You do not have to bring your arm across.  Do not twist or rotate trunk.  Hold for 5 seconds. Repeat ___10_ times per set. Do 2____ sets per session. Do _1___ sessions per day.  http://orth.exer.us/834   Copyright  VHI. All rights reserved.     Discontinued seated pallof press straight HEP. Pt verbalized understanding.   Gave SLS on R LE with L tip toe assist with bilateral UE assist 5x3 with 5 second holds daily.  Pt was recommended to sit with pillow under L hip to decrease pressure to R low back intervertebral foraminal space.  Pt was recommended to try walking with a rw this weekend to see how her sciatic symptoms feel afterwards   Pt demonstrated and verbalized understanding.

## 2017-01-22 ENCOUNTER — Ambulatory Visit: Payer: Medicare Other

## 2017-01-22 DIAGNOSIS — M5431 Sciatica, right side: Secondary | ICD-10-CM | POA: Diagnosis not present

## 2017-01-22 DIAGNOSIS — M25561 Pain in right knee: Secondary | ICD-10-CM

## 2017-01-22 DIAGNOSIS — R262 Difficulty in walking, not elsewhere classified: Secondary | ICD-10-CM

## 2017-01-22 DIAGNOSIS — M6281 Muscle weakness (generalized): Secondary | ICD-10-CM

## 2017-01-22 DIAGNOSIS — M25562 Pain in left knee: Secondary | ICD-10-CM

## 2017-01-22 DIAGNOSIS — G8929 Other chronic pain: Secondary | ICD-10-CM

## 2017-01-22 NOTE — Therapy (Signed)
[facility] [address], [place], [address] Phone: [phone]   Fax:  [phone]  Physical Therapy Treatment  Patient Details  Name: Andrea Hester MRN: [id number] Date of Birth: 15-Aug-1933 Referring Provider: Governor Specking III, MD  Encounter Date: 01/22/2017      PT End of Session - 01/22/17 1348    Visit Number 42   Number of Visits 72   Date for PT Re-Evaluation 02/15/17   Authorization Type 7   Authorization Time Period of 10   PT Start Time 1348   PT Stop Time 1436   PT Time Calculation (min) 48 min   Activity Tolerance Patient tolerated treatment well   Behavior During Therapy [facility] for tasks assessed/performed      Past Medical History:  Diagnosis Date  . Allergy   . GERD (gastroesophageal reflux disease)   . H/O Clostridium difficile infection   . History of palpitations   . Hyperlipidemia   . Migraines   . White coat hypertension     Past Surgical History:  Procedure Laterality Date  . GALLBLADDER SURGERY    . TONSILLECTOMY      There were no vitals filed for this visit.      Subjective Assessment - 01/22/17 1350    Subjective Feels sciatic pain behind R knee and calf (4.5/10 currently). The knees are stiff. Going up the steps bothers her knees at first but feels better towards the end. Pt states that using the rw help with her sciatic symptoms.    Pertinent History Bilateral knee pain. Pain began about 4 years ago when pt was going up and down the stairs. Used biofreeze which helped for about a year. 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 Pt states that her knees progressively worsened. Pt also has difficulty going up and down stairs to do laundry due to her knee pain.  Has not had any imaging for her knees.  Pt also states she might have pulled a muscle behind her R thigh from a balance exercise (standing and leaning  back) that she did.    Patient Stated Goals Be able to get out and get to church. Walk better. Pt adds that she wants to get rid of her cane.    Currently in Pain? Yes   Pain Score 4   4.5/10   Pain Onset More than a month ago                                 PT Education - 01/22/17 1410    Education provided Yes   Education Details ther-ex   [employer]) Educated Patient   Methods Explanation;Demonstration;Tactile cues;Verbal cues   Comprehension Returned demonstration;Verbalized understanding        Objective   There-ex  Reviewed HEP. Discontinued ones that increased symptoms such as her SLS with opposite tip toe assist, supine hip extension with leg straight, seated pallof press straight  Gait with SPC on L side to support R low back during R LE stance phase 100 ft   Standing hip abduction 10x3 each side to promote glute med strengthening  Seated R and L trunk rotation 10x 2 seconds for 3 sets              Slight decrease in sciatic symptoms   Seated bilateral shoulder extension isometrics 10x5  seconds for 2 sets, cues to not press R foot onto the floor to not increase R medial leg discomfort   Sit <> stand with red band resisting hip abduction/ER from elevated mat table 2x. Posterior bilateral knee discomfort, eases with rest  Standing low rows reisisting red band 10x5 seconds to promote abdominal muscle use   Standing alternating toe taps onto 1st regular step 10x, 5x, and 10x each LE with emphasis on pelvic control    Standing posterior pelvic tilting 10x2 with 5 second holds   decreased R sciatic symptoms to 3/10     Improved exercise technique, movement at target joints, use of target muscles after min to mod verbal, visual, tactile cues.    Good carry over of decreased R sciatic pain from last session. Decreased R sciatic symptoms with exercises to promote posterior pelvic tilting, and abdominal muscles use, with pain going down to  3/10 after session.           PT Long Term Goals - 01/01/17 1447      PT LONG TERM GOAL #1   Title Patient will have a decrease in R knee pain to 3/10 or less at worst and L knee pain to 2/10 or less at worst to promote ability to stand up from a chair, ascend and descend stairs, and pick up items from the ground.    Baseline Pain at worst: R knee 7/10, L knee 5/10; 6/10 R knee pain at worst, 5/10 L knee pain at worst (06/29/2016 and 07/27/2016); L knee pain 2/10 at worst,  4.5/10  R knee pain at most for the past 7 days (not counting the morning stiffness) 08/29/2016.    For the past 7 days: 5.5/10 R knee, 4/10 L knee, (not counting the morning stiffness) and 6/10 R gastroc at most (09/13/2016);  For the past 7 days: 6-7/10 R knee, 4-5/10 L knee, 8/10 R gastroc/sciatic pain at most (10/19/2016);  For the past 7 days: 4-5/10 R knee, 3/10 L knee, 4-5/10 R sciatic pain at most for the past 7 days (11/13/2016).    3-4/10 R knee pain, 9/10 L knee pain, 3-4/10 R sciatic pain at most for the past 7 days (11/28/2016);  4/10 R knee, 3/10 L knee, 4.5/10 R sciatic pain at most for the past 7 days (12/12/2016);   Pain at worst after morning stiffness: R 3.5/10, L knee 2.5/10, R sciatic 3.5/10 (01/01/2017)   Time 6   Period Weeks   Status On-going     PT LONG TERM GOAL #2   Title Patient will improve her LEFS score by at least 9 points as a demonstration of improved function.    Baseline 26/80; 29/80 (07/25/2016); 33/80 (08/29/2016); 40/80 (10/19/2016)   Time 6   Period Weeks   Status Achieved     PT LONG TERM GOAL #3   Title Patient will improve bilateral hip abduction and knee flexion strength by at least 1/2 MMT grade to promote ability to negotiate stairs and pick up items from the floor.    Time 6   Period Weeks   Status Achieved     PT LONG TERM GOAL #4   Title Patient will be able to ascend and descend 4 regular steps 3 times with bilateral UE assist with minimal to no complain of knee pain to  promote ability to go up and down her stairs at home.    Baseline Increased bilateral knee pain with stair negotiation. Able to ascend and descend 4  regular steps with bilateral UE assist mod I with improved femoral control and without knee pain (after manual therapy). Able to perform today (after hip exercises) with minimal to no reports of knee pain with reciprocal gait pattern (08/29/2016).  L knee pain currently when weight bearing (11/28/2016). Bilateral knee pain R > L (01/01/2017)   Time 6   Period Weeks   Status On-going     PT LONG TERM GOAL #5   Title Patient will be able to ambulate at least 500 ft without LOB and without use of AD.    Baseline Pt currently ambulates with a SPC. Pt able to ambulate up to 300 ft without SPC (CGA) and without LOB 09/13/2016. Currently ambulates with SPC secondary to knee pain (11/28/2016). Able to ambulate up to 400 ft today without AD, no increase in knee or sciatic symptoms. Had to stop secondary to fatigue. LOB x 1 at end of 400 ft when turning, min A from PT to recover. (12/12/2016)   Time 4   Period Weeks   Status On-going     PT LONG TERM GOAL #6   Title Patient will be able to drive to church (pt personal goal).    Baseline Pt currently unable to drive to church due to R sciatic pain.    Time 6   Period Weeks   Status On-going               Plan - 01/22/17 1411    Clinical Impression Statement Good carry over of decreased R sciatic pain from last session. Decreased R sciatic symptoms with exercises to promote posterior pelvic tilting, and abdominal muscles use, with pain going down to 3/10 after session.    Rehab Potential Good   Clinical Impairments Affecting Rehab Potential Chronicity of condition, age   PT Frequency 1x / week  1x/1-2 weeks   PT Duration 6 weeks   PT Treatment/Interventions Therapeutic exercise;Manual techniques;Aquatic Therapy;Ultrasound;Advice worker;Therapeutic activities;Patient/family  education;Neuromuscular re-education;Dry needling   PT Next Visit Plan femoral control, mechanics with stair negotiation, movement, hip strengthening, HEP   Consulted and Agree with Plan of Care Patient      Patient will benefit from skilled therapeutic intervention in order to improve the following deficits and impairments:  Pain, Improper body mechanics, Decreased strength  Visit Diagnosis: Sciatica, right side  Difficulty in walking, not elsewhere classified  Muscle weakness (generalized)  Chronic pain of right knee  Chronic pain of left knee     Problem List There are no active problems to display for this patient.   Joneen Boers PT, DPT   01/22/2017, 2:52 PM  [facility] [address] [place], [address] Phone: [phone]   Fax:  [phone]  Name: Andrea Hester MRN: [id number] Date of Birth: 1933/08/27

## 2017-01-22 NOTE — Patient Instructions (Signed)
Lumbar Rotation (Sitting)    Sitting with  Arms crossed, gently rotate trunk from side to side in a pain-free range of motion. Hold for 2 seconds. Repeat __10__ times per set. Do ___3_ sets per session. Do ___1_ sessions per day.  http://orth.exer.us/164   Copyright  VHI. All rights reserved.       Standing and holding onto something sturdy for safety and support:   Tuck your pelvis under.    Hold for 5 seconds.    Repeat 10 times   Perform 3 sets daily.

## 2017-01-29 ENCOUNTER — Ambulatory Visit: Payer: Medicare Other

## 2017-01-29 DIAGNOSIS — M5431 Sciatica, right side: Secondary | ICD-10-CM | POA: Diagnosis not present

## 2017-01-29 DIAGNOSIS — M25561 Pain in right knee: Secondary | ICD-10-CM

## 2017-01-29 DIAGNOSIS — R262 Difficulty in walking, not elsewhere classified: Secondary | ICD-10-CM

## 2017-01-29 DIAGNOSIS — M25562 Pain in left knee: Secondary | ICD-10-CM

## 2017-01-29 DIAGNOSIS — M6281 Muscle weakness (generalized): Secondary | ICD-10-CM

## 2017-01-29 DIAGNOSIS — G8929 Other chronic pain: Secondary | ICD-10-CM

## 2017-01-29 NOTE — Patient Instructions (Signed)
(  Home) Hip: Internal Rotation - Sitting    No resistance.   Pull right foot out. May hold thigh to keep thigh in neutral. Repeat ___10_ times per set. Do __3__ sets per session daily.  Copyright  VHI. All rights reserved.

## 2017-01-29 NOTE — Therapy (Signed)
[place] [facility] [address] [place], [address] Phone: [phone]   Fax:  [phone]  Physical Therapy Treatment  Patient Details  Name: Andrea Hester MRN: [id number] Date of Birth: 1933-05-30 Referring Provider: Governor Specking III, MD  Encounter Date: 01/29/2017      PT End of Session - 01/29/17 1117    Visit Number 43   Number of Visits 12   Date for PT Re-Evaluation 02/15/17   Authorization Type 8   Authorization Time Period of 10   PT Start Time 1117   PT Stop Time 1204   PT Time Calculation (min) 47 min   Activity Tolerance Patient tolerated treatment well   Behavior During Therapy [facility] for tasks assessed/performed      Past Medical History:  Diagnosis Date  . Allergy   . GERD (gastroesophageal reflux disease)   . H/O Clostridium difficile infection   . History of palpitations   . Hyperlipidemia   . Migraines   . White coat hypertension     Past Surgical History:  Procedure Laterality Date  . GALLBLADDER SURGERY    . TONSILLECTOMY      There were no vitals filed for this visit.      Subjective Assessment - 01/29/17 1119    Subjective Feels symptoms in R calf. Not a lot. Maybe a 3/10 currently. Feels stiffness behind both knees.   The exercise with the pillow squeeze between the knees help.  Stiff R knee joint, and L knee joint, not pain. Pain is just behind R knee and calf.  Pt states does not know of having a history of osteoporosis (had a bone density test 20 years ago, no signs of osteoporosis).     Pertinent History Bilateral knee pain. Pain began about 4 years ago when pt was going up and down the stairs. Used biofreeze which helped for about a year. 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 Pt states that her knees progressively worsened. Pt also has difficulty going up and down stairs to do laundry due to her knee pain.   Has not had any imaging for her knees.  Pt also states she might have pulled a muscle behind her R thigh from a balance exercise (standing and leaning back) that she did.    Patient Stated Goals Be able to get out and get to church. Walk better. Pt adds that she wants to get rid of her cane.    Currently in Pain? Yes   Pain Score 3    Pain Onset More than a month ago            [facility] PT Assessment - 01/29/17 1123      PROM   Overall PROM Comments Supine hip at 90/90 position   Right Hip External Rotation  52   Right Hip Internal Rotation  10   Left Hip External Rotation  35   Left Hip Internal Rotation  7                             PT Education - 01/29/17 1135    Education provided Yes   Education Details ther-ex, HEP   Person(s) Educated Patient   Methods Explanation;Demonstration;Tactile cues;Verbal cues;Handout   Comprehension Verbalized understanding;Returned demonstration        Objective  Pt states does not know of having  a history of osteoporosis (had a bone density test 20 years ago, no signs of osteoporosis).   No reproduction of R sciatic symptoms with P to A to low back area   There-ex  Supine PROM hip IR and ER at 90/90 position   Supine manual R hip IR stretch by PT, R foot medial to L knee, L LE at midline. Pillow under L knee for comfort.  Slight decreased in R posterior knee and calf stiffness in sitting afterwards. Slight anterior R knee discomfort  Seated R hip IR 10x3. Decreased R sciatic symptoms  Reviewed and given as part of her HEP. Pt demonstrated and verbalized understanding.   Seated R and L trunk rotation 10x 2 seconds for 2 sets   Seated bilateral shoulder extension isometrics 10x5 seconds for 2 sets, cues to not press R foot onto the floor to not increase R medial leg discomfort   Seated L hip IR 10x3 . No change in R sciatic symptoms   Seated low rows resisting red band 10x5 seconds for 2 sets to promote  abdominal muscle use with R foot on 3 inch step. No change in R sciatic symptoms  Standing L side bend to stretch R side 10x5 seconds. Decreased R sciatic symptoms   Improved exercise technique, movement at target joints, use of target muscles after min to mod verbal, visual, tactile cues.   Pt demonstrates limited bilateral hip IR ROM in supine 90/90 position. Decreased R sciatic symptoms after manual R hip IR stretch and seated R hip IR exercise to help maintain ROM gained. Overall, patient R sciatic pain seems to steadily decrease from one session to the next with exercises that promote increase in R intervertebral foraminal opening in lumbar spine area as well as increasing core muscle and R glute muscle use and decreasing R piriformis muscle use.          PT Long Term Goals - 01/01/17 1447      PT LONG TERM GOAL #1   Title Patient will have a decrease in R knee pain to 3/10 or less at worst and L knee pain to 2/10 or less at worst to promote ability to stand up from a chair, ascend and descend stairs, and pick up items from the ground.    Baseline Pain at worst: R knee 7/10, L knee 5/10; 6/10 R knee pain at worst, 5/10 L knee pain at worst (06/29/2016 and 07/27/2016); L knee pain 2/10 at worst,  4.5/10  R knee pain at most for the past 7 days (not counting the morning stiffness) 08/29/2016.    For the past 7 days: 5.5/10 R knee, 4/10 L knee, (not counting the morning stiffness) and 6/10 R gastroc at most (09/13/2016);  For the past 7 days: 6-7/10 R knee, 4-5/10 L knee, 8/10 R gastroc/sciatic pain at most (10/19/2016);  For the past 7 days: 4-5/10 R knee, 3/10 L knee, 4-5/10 R sciatic pain at most for the past 7 days (11/13/2016).    3-4/10 R knee pain, 9/10 L knee pain, 3-4/10 R sciatic pain at most for the past 7 days (11/28/2016);  4/10 R knee, 3/10 L knee, 4.5/10 R sciatic pain at most for the past 7 days (12/12/2016);   Pain at worst after morning stiffness: R 3.5/10, L knee 2.5/10, R sciatic  3.5/10 (01/01/2017)   Time 6   Period Weeks   Status On-going     PT LONG TERM GOAL #2   Title Patient will improve  her LEFS score by at least 9 points as a demonstration of improved function.    Baseline 26/80; 29/80 (07/25/2016); 33/80 (08/29/2016); 40/80 (10/19/2016)   Time 6   Period Weeks   Status Achieved     PT LONG TERM GOAL #3   Title Patient will improve bilateral hip abduction and knee flexion strength by at least 1/2 MMT grade to promote ability to negotiate stairs and pick up items from the floor.    Time 6   Period Weeks   Status Achieved     PT LONG TERM GOAL #4   Title Patient will be able to ascend and descend 4 regular steps 3 times with bilateral UE assist with minimal to no complain of knee pain to promote ability to go up and down her stairs at home.    Baseline Increased bilateral knee pain with stair negotiation. Able to ascend and descend 4 regular steps with bilateral UE assist mod I with improved femoral control and without knee pain (after manual therapy). Able to perform today (after hip exercises) with minimal to no reports of knee pain with reciprocal gait pattern (08/29/2016).  L knee pain currently when weight bearing (11/28/2016). Bilateral knee pain R > L (01/01/2017)   Time 6   Period Weeks   Status On-going     PT LONG TERM GOAL #5   Title Patient will be able to ambulate at least 500 ft without LOB and without use of AD.    Baseline Pt currently ambulates with a SPC. Pt able to ambulate up to 300 ft without SPC (CGA) and without LOB 09/13/2016. Currently ambulates with SPC secondary to knee pain (11/28/2016). Able to ambulate up to 400 ft today without AD, no increase in knee or sciatic symptoms. Had to stop secondary to fatigue. LOB x 1 at end of 400 ft when turning, min A from PT to recover. (12/12/2016)   Time 4   Period Weeks   Status On-going     PT LONG TERM GOAL #6   Title Patient will be able to drive to church (pt personal goal).    Baseline Pt  currently unable to drive to church due to R sciatic pain.    Time 6   Period Weeks   Status On-going               Plan - 01/29/17 1114    Clinical Impression Statement Pt demonstrates limited bilateral hip IR ROM in supine 90/90 position. Decreased R sciatic symptoms after manual R hip IR stretch and seated R hip IR exercise to help maintain ROM gained. Overall, patient R sciatic pain seems to steadily decrease from one session to the next with exercises that promote increase in R intervertebral foraminal opening in lumbar spine area as well as increasing core muscle and R glute muscle use and decreasing R piriformis muscle use.    Rehab Potential Good   Clinical Impairments Affecting Rehab Potential Chronicity of condition, age   PT Frequency 1x / week  1x/1-2 weeks   PT Duration 6 weeks   PT Treatment/Interventions Therapeutic exercise;Manual techniques;Aquatic Therapy;Ultrasound;Advice worker;Therapeutic activities;Patient/family education;Neuromuscular re-education;Dry needling   PT Next Visit Plan femoral control, mechanics with stair negotiation, movement, hip strengthening, HEP   Consulted and Agree with Plan of Care Patient      Patient will benefit from skilled therapeutic intervention in order to improve the following deficits and impairments:  Pain, Improper body mechanics, Decreased strength  Visit Diagnosis: Difficulty in  walking, not elsewhere classified  Muscle weakness (generalized)  Sciatica, right side  Chronic pain of right knee  Chronic pain of left knee     Problem List There are no active problems to display for this patient.   Joneen Boers PT, DPT   01/29/2017, 12:20 PM  [place] [facility] [address], [place], [address] Phone: [phone]   Fax:  [phone]  Name: Andrea Hester MRN: [id number] Date of Birth: 10/05/1933

## 2017-02-06 ENCOUNTER — Ambulatory Visit: Payer: Medicare Other | Attending: Orthopedic Surgery

## 2017-02-06 DIAGNOSIS — G8929 Other chronic pain: Secondary | ICD-10-CM

## 2017-02-06 DIAGNOSIS — M25561 Pain in right knee: Secondary | ICD-10-CM | POA: Insufficient documentation

## 2017-02-06 DIAGNOSIS — M25562 Pain in left knee: Secondary | ICD-10-CM | POA: Diagnosis present

## 2017-02-06 DIAGNOSIS — M5431 Sciatica, right side: Secondary | ICD-10-CM | POA: Diagnosis present

## 2017-02-06 DIAGNOSIS — R262 Difficulty in walking, not elsewhere classified: Secondary | ICD-10-CM | POA: Diagnosis not present

## 2017-02-06 DIAGNOSIS — M6281 Muscle weakness (generalized): Secondary | ICD-10-CM | POA: Diagnosis present

## 2017-02-06 NOTE — Patient Instructions (Signed)
  Gave S/L hip IR 10x3 daily with pillows between knees to keep thighs in neutral as part of her HEP. Pt demonstrated and verbalized understanding.

## 2017-02-06 NOTE — Therapy (Signed)
[place] [facility] [address], [place], [address] Phone: [phone]   Fax:  [phone]  Physical Therapy Treatment  Patient Details  Name: Andrea Hester MRN: [id number] Date of Birth: 1933/09/11 Referring Provider: Governor Specking III, MD  Encounter Date: 02/06/2017      PT End of Session - 02/06/17 1433    Visit Number 44   Number of Visits 19   Date for PT Re-Evaluation 02/15/17   Authorization Type 9   Authorization Time Period of 10   PT Start Time 1434   PT Stop Time 1543   PT Time Calculation (min) 69 min   Activity Tolerance Patient tolerated treatment well   Behavior During Therapy [facility] for tasks assessed/performed      Past Medical History:  Diagnosis Date  . Allergy   . GERD (gastroesophageal reflux disease)   . H/O Clostridium difficile infection   . History of palpitations   . Hyperlipidemia   . Migraines   . White coat hypertension     Past Surgical History:  Procedure Laterality Date  . GALLBLADDER SURGERY    . TONSILLECTOMY      There were no vitals filed for this visit.      Subjective Assessment - 02/06/17 1435    Subjective MD took her off her hydrochlorothiazide because it was not doing anything for her R leg medial swelling. Might be fungal infection? MD does not know what is going on. MD recommended her to return to her dermatologist for a biopsy if nothing helps.  R sciatica is not that great today. Feels a lot of stiffness in her knees which seem to not go away. Walking helped her knees.  3.5-4/10 today for sciatic symptoms behind the knee. Feels stiffness at her knee caps both knees. L posterior knee started bothering her last week too. 4/10 R sciatic pain at most for the past 7 days (including the calf muscle area).  6.5/10 bilateral knee pain at most for the past 7 days when patient stands up from sitting for 15 minutes).   Pertinent History Bilateral knee pain. Pain  began about 4 years ago when pt was going up and down the stairs. Used biofreeze which helped for about a year. 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 Pt states that her knees progressively worsened. Pt also has difficulty going up and down stairs to do laundry due to her knee pain.  Has not had any imaging for her knees.  Pt also states she might have pulled a muscle behind her R thigh from a balance exercise (standing and leaning back) that she did.    Patient Stated Goals Be able to get out and get to church. Walk better. Pt adds that she wants to get rid of her cane.    Currently in Pain? Yes   Pain Score 4   3.5/10 to 4/10 currently R sciatic symptoms   Pain Onset More than a month ago                                 PT Education - 02/06/17 1446    Education provided Yes   Education Details ther-ex   [employer]) Educated Patient   Methods Explanation;Demonstration;Tactile cues;Verbal cues   Comprehension Returned demonstration;Verbalized understanding        Objective  There-ex  Seated glute max squeeze 10x10 seconds  Seated bilateral hip IR 10x3  L S/L R hip IR with pillow and towel between knees for comfort 10x3  Decreased R posterior knee pain with standing up from sitting.   R S/L L hip IR with pillow and towel between knees for comfort 10x3  Decreased L posterior knee pain with standing up from sitting.   Supine manual R hip IR stretch by PT, R foot medial to L knee, L LE at midline. Pillow under L knee for comfort.  decresaed sciatic symptoms   Gait x 500 ft without using AD but holding SPC if she needs it. No LOB. Slow and steady pace.     Improved exercise technique, movement at target joints, use of target muscles after min to mod verbal, visual, tactile cues.      Manual therapy   Medial glide to R patella grade 3 in sitting, knee flexed to about 90  degrees  Medial glide to L patella grade 3 in sitting, knee flexion to about 90 degrees    Decreased bilatearl knee stiffness    E-Stim x 15 min  VMO R and L 10 seconds knee extension/ 10 seconds relaxation, alternating knees Channel 1: R knee 19 mA Channel 2: L knee 19.5 mA   3.5-4/10 R knee pain, 3/10 R sciatic, 2.5-3/10 L knee pain after session (2/10 L posterior knee symptoms). Decreased R sciatic symptoms with R hip IR, decreased bilateral knee stiffness with medial glide to bilateral patellae. Utilized [place] E-stim to bilateral VMO at end of session to promote medial glide of both patellae to hopefully decrease knee stiffness. R sciatic pain seems to be stabilizing at 3.5-4/10        PT Long Term Goals - 01/01/17 1447      PT LONG TERM GOAL #1   Title Patient will have a decrease in R knee pain to 3/10 or less at worst and L knee pain to 2/10 or less at worst to promote ability to stand up from a chair, ascend and descend stairs, and pick up items from the ground.    Baseline Pain at worst: R knee 7/10, L knee 5/10; 6/10 R knee pain at worst, 5/10 L knee pain at worst (06/29/2016 and 07/27/2016); L knee pain 2/10 at worst,  4.5/10  R knee pain at most for the past 7 days (not counting the morning stiffness) 08/29/2016.    For the past 7 days: 5.5/10 R knee, 4/10 L knee, (not counting the morning stiffness) and 6/10 R gastroc at most (09/13/2016);  For the past 7 days: 6-7/10 R knee, 4-5/10 L knee, 8/10 R gastroc/sciatic pain at most (10/19/2016);  For the past 7 days: 4-5/10 R knee, 3/10 L knee, 4-5/10 R sciatic pain at most for the past 7 days (11/13/2016).    3-4/10 R knee pain, 9/10 L knee pain, 3-4/10 R sciatic pain at most for the past 7 days (11/28/2016);  4/10 R knee, 3/10 L knee, 4.5/10 R sciatic pain at most for the past 7 days (12/12/2016);   Pain at worst after morning stiffness: R 3.5/10, L knee 2.5/10, R sciatic 3.5/10 (01/01/2017)   Time 6   Period Weeks   Status On-going      PT LONG TERM GOAL #2   Title Patient will improve her LEFS score by at least 9 points as a demonstration of improved function.    Baseline 26/80; 29/80 (07/25/2016); 33/80 (08/29/2016); 40/80 (10/19/2016)  Time 6   Period Weeks   Status Achieved     PT LONG TERM GOAL #3   Title Patient will improve bilateral hip abduction and knee flexion strength by at least 1/2 MMT grade to promote ability to negotiate stairs and pick up items from the floor.    Time 6   Period Weeks   Status Achieved     PT LONG TERM GOAL #4   Title Patient will be able to ascend and descend 4 regular steps 3 times with bilateral UE assist with minimal to no complain of knee pain to promote ability to go up and down her stairs at home.    Baseline Increased bilateral knee pain with stair negotiation. Able to ascend and descend 4 regular steps with bilateral UE assist mod I with improved femoral control and without knee pain (after manual therapy). Able to perform today (after hip exercises) with minimal to no reports of knee pain with reciprocal gait pattern (08/29/2016).  L knee pain currently when weight bearing (11/28/2016). Bilateral knee pain R > L (01/01/2017)   Time 6   Period Weeks   Status On-going     PT LONG TERM GOAL #5   Title Patient will be able to ambulate at least 500 ft without LOB and without use of AD.    Baseline Pt currently ambulates with a SPC. Pt able to ambulate up to 300 ft without SPC (CGA) and without LOB 09/13/2016. Currently ambulates with SPC secondary to knee pain (11/28/2016). Able to ambulate up to 400 ft today without AD, no increase in knee or sciatic symptoms. Had to stop secondary to fatigue. LOB x 1 at end of 400 ft when turning, min A from PT to recover. (12/12/2016)   Time 4   Period Weeks   Status On-going     PT LONG TERM GOAL #6   Title Patient will be able to drive to church (pt personal goal).    Baseline Pt currently unable to drive to church due to R sciatic pain.     Time 6   Period Weeks   Status On-going               Plan - 02/06/17 1447    Clinical Impression Statement 3.5-4/10 R knee pain, 3/10 R sciatic, 2.5-3/10 L knee pain after session (2/10 L posterior knee symptoms). Decreased R sciatic symptoms with R hip IR, decreased bilateral knee stiffness with medial glide to bilateral patellae. Utilized [place] E-stim to bilateral VMO at end of session to promote medial glide of both patellae to hopefully decrease knee stiffness. R sciatic pain seems to be stabilizing at 3.5-4/10   Rehab Potential Good   Clinical Impairments Affecting Rehab Potential Chronicity of condition, age   PT Frequency 1x / week  1x/1-2 weeks   PT Duration 6 weeks   PT Treatment/Interventions Therapeutic exercise;Manual techniques;Aquatic Therapy;Ultrasound;Advice worker;Therapeutic activities;Patient/family education;Neuromuscular re-education;Dry needling   PT Next Visit Plan femoral control, mechanics with stair negotiation, movement, hip strengthening, HEP   Consulted and Agree with Plan of Care Patient      Patient will benefit from skilled therapeutic intervention in order to improve the following deficits and impairments:  Pain, Improper body mechanics, Decreased strength  Visit Diagnosis: Difficulty in walking, not elsewhere classified  Muscle weakness (generalized)  Sciatica, right side  Chronic pain of right knee  Chronic pain of left knee     Problem List There are no active problems to display for this  patient.   Joneen Boers PT, DPT   02/06/2017, 8:04 PM  [place] [facility] [address], [place], [address] Phone: [phone]   Fax:  [phone]  Name: SHERA LAUBACH MRN: [id number] Date of Birth: 12-Sep-1933

## 2017-02-14 ENCOUNTER — Ambulatory Visit: Payer: Medicare Other

## 2017-02-14 DIAGNOSIS — R262 Difficulty in walking, not elsewhere classified: Secondary | ICD-10-CM

## 2017-02-14 DIAGNOSIS — M25562 Pain in left knee: Secondary | ICD-10-CM

## 2017-02-14 DIAGNOSIS — M5431 Sciatica, right side: Secondary | ICD-10-CM

## 2017-02-14 DIAGNOSIS — M25561 Pain in right knee: Secondary | ICD-10-CM

## 2017-02-14 DIAGNOSIS — M6281 Muscle weakness (generalized): Secondary | ICD-10-CM

## 2017-02-14 DIAGNOSIS — G8929 Other chronic pain: Secondary | ICD-10-CM

## 2017-02-14 NOTE — Therapy (Signed)
[facility] [address] [place], [address] Phone: [phone]   Fax:  [phone]  Physical Therapy Treatment And Progress Report  Patient Details  Name: Andrea Hester MRN: [id number] Date of Birth: 07/01/1933 Referring Provider: Frazier Richards III, MD  Encounter Date: 02/14/2017      PT End of Session - 02/14/17 1304    Visit Number 45   Number of Visits 30   Date for PT Re-Evaluation 03/08/17   Authorization Type 1   Authorization Time Period of 10   PT Start Time 1305   PT Stop Time 1355   PT Time Calculation (min) 50 min   Activity Tolerance Patient tolerated treatment well   Behavior During Therapy [facility] for tasks assessed/performed      Past Medical History:  Diagnosis Date  . Allergy   . GERD (gastroesophageal reflux disease)   . H/O Clostridium difficile infection   . History of palpitations   . Hyperlipidemia   . Migraines   . White coat hypertension     Past Surgical History:  Procedure Laterality Date  . GALLBLADDER SURGERY    . TONSILLECTOMY      There were no vitals filed for this visit.      Subjective Assessment - 02/14/17 1306    Subjective Pt states feels like she can bend her knees better when using the rw. Walking helps with her knee stiffness. Has been trying to bend her knee as she walks with both the cane and the rw.  Was tired after last session and felt tired the rest of the week. Now feels better.  Wonders if the electrical stimulation made her tired last week.  R hip was sore for 3 days after last session.  Wants to hold off on the machine today.  The exercises help at home.  Feels like she can drive to church with the pillows in the car seat but has not tried driving to church.  3.5 to 4/10 R sciatic pain, 3.5/10 R knee, 3/10 L knee currently. Pt states doing her exercises (pillow squeeze, seated knee flexion). Pt states wanting to try 2 more sessions after  today to make sure she is ok prior to continuing with her HEP.     Pertinent History Bilateral knee pain. Pain began about 4 years ago when pt was going up and down the stairs. Used biofreeze which helped for about a year. 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 Pt states that her knees progressively worsened. Pt also has difficulty going up and down stairs to do laundry due to her knee pain.  Has not had any imaging for her knees.  Pt also states she might have pulled a muscle behind her R thigh from a balance exercise (standing and leaning back) that she did.    Patient Stated Goals Be able to get out and get to church. Walk better. Pt adds that she wants to get rid of her cane.    Currently in Pain? Yes   Pain Score 4   R sciatic pain   Pain Onset More than a month ago            [facility] PT Assessment - 02/14/17 0001      Assessment   Referring Provider Frazier Richards III, MD  PT Education - 02/14/17 1315    Education provided Yes   Education Details ther-ex   [employer]) Educated Patient   Methods Explanation;Demonstration;Tactile cues;Verbal cues   Comprehension Returned demonstration;Verbalized understanding        Objective  3.5 to 4/10 R sciatic pain, 3.5/10 R knee, 3/10 L knee currently. Pt states doing her exercises (pillow squeeze, seated knee flexion). Pt states wanting to try 2 more sessions after today to make sure she is ok prior to continuing with her HEP. Increased time spent on listening to pt subjective reports.     There-ex  Seated hip adduction ball squeeze with glute max squeeze. 10x10 seconds  Knees feel better.   Seated manually resisted bilateral shoulder extension with pt holding PVC bar 10x5 seconds for 3 sets   Reviewed progress with pain goal with pt.   Reviewed plan of care: continue 2 more sessions prior to DC to HEP   NuStep at  end of session (no charge) x 5 min. Seat 8, arms 8, level 1  No change in knee stiffness    Improved exercise technique, movement at target joints, use of target muscles after min verbal, visual, tactile cues.     Manual therapy   Medial glide to R patella grade 3 in sitting, knee flexed to about 90 degrees  Medial glide to L patella grade 3 in sitting, knee flexion to about 90 degrees    Decreased bilateral knee stiffness   Pt demonstrates overall decreased R sciatic and bilateral knee pain (after exercises) level at worst since initial evaluation with R sciatic pain stabilizing around 3.5-4/10 at most. Stiffness and symptoms return when pt is in static positions such as sitting for about 10 min.  Exercises activating her glute max, hip adductors, VMO, trunk muscles, exercises that help decrease piriformis muscle activation, and exercises that promote thoracic extension helps improve her sciatic and bilateral knee pain. Pt currently consistent in performing her HEP. Pt will benefit from at least 2 more PT sessions per pt request to make sure she is ok prior to graduating to her HEP to continue to decrease bilateral knee and sciatic pain, improve ability to drive and ambulate.               PT Long Term Goals - 02/14/17 1318      PT LONG TERM GOAL #1   Title Patient will have a decrease in R knee pain to 3/10 or less at worst and L knee pain to 2/10 or less at worst to promote ability to stand up from a chair, ascend and descend stairs, and pick up items from the ground.    Baseline Pain at worst: R knee 7/10, L knee 5/10; 6/10 R knee pain at worst, 5/10 L knee pain at worst (06/29/2016 and 07/27/2016); L knee pain 2/10 at worst,  4.5/10  R knee pain at most for the past 7 days (not counting the morning stiffness) 08/29/2016.    For the past 7 days: 5.5/10 R knee, 4/10 L knee, (not counting the morning stiffness) and 6/10 R gastroc at most (09/13/2016);  For the past 7 days:  6-7/10 R knee, 4-5/10 L knee, 8/10 R gastroc/sciatic pain at most (10/19/2016);  For the past 7 days: 4-5/10 R knee, 3/10 L knee, 4-5/10 R sciatic pain at most for the past 7 days (11/13/2016).    3-4/10 R knee pain, 9/10 L knee pain, 3-4/10 R sciatic pain at most for the past 7 days (11/28/2016);  4/10 R knee, 3/10 L knee, 4.5/10 R sciatic pain at most for the past 7 days (12/12/2016);   Pain at worst after morning stiffness: R 3.5/10, L knee 2.5/10, R sciatic 3.5/10 (01/01/2017);  3.5-4/10 R sciatic pain, 3.02/09/09 R knee pain (after getting her stiffness out), 2.5 to 3/10 at most (after getting her stiffness out) at most for the past 7 days 02/14/2017.     Time 3   Period Weeks   Status On-going     PT LONG TERM GOAL #2   Title Patient will improve her LEFS score by at least 9 points as a demonstration of improved function.    Baseline 26/80; 29/80 (07/25/2016); 33/80 (08/29/2016); 40/80 (10/19/2016)   Time 6   Period Weeks   Status Achieved     PT LONG TERM GOAL #3   Title Patient will improve bilateral hip abduction and knee flexion strength by at least 1/2 MMT grade to promote ability to negotiate stairs and pick up items from the floor.    Time 6   Period Weeks   Status Achieved     PT LONG TERM GOAL #4   Title Patient will be able to ascend and descend 4 regular steps 3 times with bilateral UE assist with minimal to no complain of knee pain to promote ability to go up and down her stairs at home.    Baseline Increased bilateral knee pain with stair negotiation. Able to ascend and descend 4 regular steps with bilateral UE assist mod I with improved femoral control and without knee pain (after manual therapy). Able to perform today (after hip exercises) with minimal to no reports of knee pain with reciprocal gait pattern (08/29/2016).  L knee pain currently when weight bearing (11/28/2016). Bilateral knee pain R > L (01/01/2017)   Time 3   Period Weeks   Status On-going     PT LONG TERM GOAL #5    Title Patient will be able to ambulate at least 500 ft without LOB and without use of AD.    Baseline Pt currently ambulates with a SPC. Pt able to ambulate up to 300 ft without SPC (CGA) and without LOB 09/13/2016. Currently ambulates with SPC secondary to knee pain (11/28/2016). Able to ambulate up to 400 ft today without AD, no increase in knee or sciatic symptoms. Had to stop secondary to fatigue. LOB x 1 at end of 400 ft when turning, min A from PT to recover. (12/12/2016); Pt was able to ambulate 500 ft without AD, no LOB (02/06/2017)   Time 4   Period Weeks   Status Achieved     PT LONG TERM GOAL #6   Title Patient will be able to drive to church (pt personal goal).    Baseline Pt currently unable to drive to church due to R sciatic pain.    Time 3   Period Weeks   Status On-going               Plan - 02/14/17 1316    Clinical Impression Statement Pt demonstrates overall decreased R sciatic and bilateral knee pain (after exercises) level at worst since initial evaluation with R sciatic pain stabilizing around 3.5-4/10 at most. Stiffness and symptoms return when pt is in static positions such as sitting for about 10 min.  Exercises activating her glute max, hip adductors, VMO, trunk muscles, exercises that help decrease piriformis muscle activation, and exercises that promote thoracic extension helps improve her sciatic and bilateral knee pain.  Pt currently consistent in performing her HEP. Pt will benefit from at least 2 more PT sessions per pt request to make sure she is ok prior to graduating to her HEP to continue to decrease bilateral knee and sciatic pain, improve ability to drive and ambulate.    Rehab Potential Good   Clinical Impairments Affecting Rehab Potential Chronicity of condition, age   PT Frequency 1x / week  1-2x/week   PT Duration 3 weeks   PT Treatment/Interventions Therapeutic exercise;Manual techniques;Aquatic Therapy;Ultrasound;TEFL teacher;Therapeutic activities;Patient/family education;Neuromuscular re-education;Dry needling   PT Next Visit Plan femoral control, mechanics with stair negotiation, movement, hip strengthening, HEP   Consulted and Agree with Plan of Care Patient      Patient will benefit from skilled therapeutic intervention in order to improve the following deficits and impairments:  Pain, Improper body mechanics, Decreased strength  Visit Diagnosis: Sciatica, right side - Plan: PT plan of care cert/re-cert  Difficulty in walking, not elsewhere classified - Plan: PT plan of care cert/re-cert  Muscle weakness (generalized) - Plan: PT plan of care cert/re-cert  Chronic pain of right knee - Plan: PT plan of care cert/re-cert  Chronic pain of left knee - Plan: PT plan of care cert/re-cert       G-Codes - 02-24-17 1936    Functional Assessment Tool Used (Outpatient Only) LEFS, clinical presentation, patient interview.   Functional Limitation Mobility: Walking and moving around   Mobility: Walking and Moving Around Current Status [id number]) At least 40 percent but less than 60 percent impaired, limited or restricted   Mobility: Walking and Moving Around Goal Status [phone]) At least 20 percent but less than 40 percent impaired, limited or restricted      Problem List There are no active problems to display for this patient.  Thank you for your referral.  Joneen Boers PT, DPT   2017-02-24, 7:44 PM  [place] [facility] [address] [place], [address] Phone: [phone]   Fax:  [phone]  Name: Andrea Hester MRN: [id number] Date of Birth: 01/03/1933

## 2017-02-15 ENCOUNTER — Telehealth: Payer: Self-pay

## 2017-02-15 NOTE — Telephone Encounter (Signed)
Pt called earlier today to let me know that once she got home after last session, her knees DID feel better after working on the machine (NuStep machine).

## 2017-02-19 ENCOUNTER — Ambulatory Visit: Payer: Medicare Other

## 2017-02-19 DIAGNOSIS — G8929 Other chronic pain: Secondary | ICD-10-CM

## 2017-02-19 DIAGNOSIS — R262 Difficulty in walking, not elsewhere classified: Secondary | ICD-10-CM | POA: Diagnosis not present

## 2017-02-19 DIAGNOSIS — M6281 Muscle weakness (generalized): Secondary | ICD-10-CM

## 2017-02-19 DIAGNOSIS — M25561 Pain in right knee: Secondary | ICD-10-CM

## 2017-02-19 DIAGNOSIS — M5431 Sciatica, right side: Secondary | ICD-10-CM

## 2017-02-19 DIAGNOSIS — M25562 Pain in left knee: Secondary | ICD-10-CM

## 2017-02-19 NOTE — Patient Instructions (Addendum)
Gave standing marches, and side stepping as part of her HEP for balance so long as she is near something sturdy to hold on to for safety. Pt to perform however many repetitions feels comfortable for her knees daily. Pt demonstrated and verbalized understanding.

## 2017-02-19 NOTE — Therapy (Signed)
[place] [facility] [address], [place], [address] Phone: [phone]   Fax:  [phone]  Physical Therapy Treatment  Patient Details  Name: Andrea Hester MRN: [id number] Date of Birth: [birth year] Referring Provider: Frazier Richards III, MD  Encounter Date: 02/19/2017      PT End of Session - 02/19/17 1523    Visit Number 28   Number of Visits 52   Date for PT Re-Evaluation 03/08/17   Authorization Type 2   Authorization Time Period of 10   PT Start Time 1523   PT Stop Time 1601   PT Time Calculation (min) 38 min   Activity Tolerance Patient tolerated treatment well   Behavior During Therapy [facility] for tasks assessed/performed      Past Medical History:  Diagnosis Date  . Allergy   . GERD (gastroesophageal reflux disease)   . H/O Clostridium difficile infection   . History of palpitations   . Hyperlipidemia   . Migraines   . White coat hypertension     Past Surgical History:  Procedure Laterality Date  . GALLBLADDER SURGERY    . TONSILLECTOMY      There were no vitals filed for this visit.      Subjective Assessment - 02/19/17 1524    Subjective Pt states that her knees felt better after using the NuStep machine after last session.  2.5/10 R knee, 2/10 L knee. 0/10 R sciatic symptoms currently. Pt states feeling her equilibrium is off. Felt a little dizzy last week. Pt states that her knees feel looser.   Not dizzy currently but does not feel steady. Feels like she might topple over to the side.  Also has a hole in her ear drum.    Pertinent History Bilateral knee pain. Pain began about 4 years ago when pt was going up and down the stairs. Used biofreeze which helped for about a year. 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 Pt states that her knees progressively worsened. Pt also has difficulty going up and down stairs to do laundry due  to her knee pain.  Has not had any imaging for her knees.  Pt also states she might have pulled a muscle behind her R thigh from a balance exercise (standing and leaning back) that she did.    Patient Stated Goals Be able to get out and get to church. Walk better. Pt adds that she wants to get rid of her cane.    Currently in Pain? Yes   Pain Score 2   2.5/10   Pain Onset More than a month ago                                 PT Education - 02/19/17 1534    Education provided Yes   Education Details ther-ex, HEP   Person(s) Educated Patient   Methods Explanation;Demonstration;Tactile cues;Verbal cues   Comprehension Returned demonstration;Verbalized understanding        Objective  Time taken to listen to pt subjective reports.    There-ex   Vitals obtained. Blood pressure L arm sitting, mechanically taken. 148/70, HR 76  Standing alternating toe taps onto treadmill platform 10x2 each LE with light touch assist  Standing marches with light touch assist 10x each LE  Side stepping 5 ft to the R and 5 ft to the  L 5x. Light touch assist  NuStep at end of session (no charge) x 5 min. Seat 8, arms 8, level 1 to promote movement at knees   Improved exercise technique, movement at target joints, use of target muscles after min to mod verbal, visual, tactile cues.      Manual therapy   Medial glide to R patella grade 3 in sitting, knee flexed  Medial glide to L patella grade 3 in sitting, knee flexed   Improved patellar mobility following manual therapy. Ended with NuStep machine to continue movement and promote carryover. Pt was recommended to try to find a gym that has the NuStep machine to help continue her progress once she is finished with PT. Worked on standing balance related activities to help decrease fall risk at home.         PT Long Term Goals - 02/14/17 1318      PT LONG TERM GOAL #1   Title Patient will have a decrease in R  knee pain to 3/10 or less at worst and L knee pain to 2/10 or less at worst to promote ability to stand up from a chair, ascend and descend stairs, and pick up items from the ground.    Baseline Pain at worst: R knee 7/10, L knee 5/10; 6/10 R knee pain at worst, 5/10 L knee pain at worst (06/29/2016 and 07/27/2016); L knee pain 2/10 at worst,  4.5/10  R knee pain at most for the past 7 days (not counting the morning stiffness) 08/29/2016.    For the past 7 days: 5.5/10 R knee, 4/10 L knee, (not counting the morning stiffness) and 6/10 R gastroc at most (09/13/2016);  For the past 7 days: 6-7/10 R knee, 4-5/10 L knee, 8/10 R gastroc/sciatic pain at most (10/19/2016);  For the past 7 days: 4-5/10 R knee, 3/10 L knee, 4-5/10 R sciatic pain at most for the past 7 days (11/13/2016).    3-4/10 R knee pain, 9/10 L knee pain, 3-4/10 R sciatic pain at most for the past 7 days (11/28/2016);  4/10 R knee, 3/10 L knee, 4.5/10 R sciatic pain at most for the past 7 days (12/12/2016);   Pain at worst after morning stiffness: R 3.5/10, L knee 2.5/10, R sciatic 3.5/10 (01/01/2017);  3.5-4/10 R sciatic pain, 3.02/09/09 R knee pain (after getting her stiffness out), 2.5 to 3/10 at most (after getting her stiffness out) at most for the past 7 days 02/14/2017.     Time 3   Period Weeks   Status On-going     PT LONG TERM GOAL #2   Title Patient will improve her LEFS score by at least 9 points as a demonstration of improved function.    Baseline 26/80; 29/80 (07/25/2016); 33/80 (08/29/2016); 40/80 (10/19/2016)   Time 6   Period Weeks   Status Achieved     PT LONG TERM GOAL #3   Title Patient will improve bilateral hip abduction and knee flexion strength by at least 1/2 MMT grade to promote ability to negotiate stairs and pick up items from the floor.    Time 6   Period Weeks   Status Achieved     PT LONG TERM GOAL #4   Title Patient will be able to ascend and descend 4 regular steps 3 times with bilateral UE assist with minimal to  no complain of knee pain to promote ability to go up and down her stairs at home.    Baseline Increased bilateral knee  pain with stair negotiation. Able to ascend and descend 4 regular steps with bilateral UE assist mod I with improved femoral control and without knee pain (after manual therapy). Able to perform today (after hip exercises) with minimal to no reports of knee pain with reciprocal gait pattern (08/29/2016).  L knee pain currently when weight bearing (11/28/2016). Bilateral knee pain R > L (01/01/2017)   Time 3   Period Weeks   Status On-going     PT LONG TERM GOAL #5   Title Patient will be able to ambulate at least 500 ft without LOB and without use of AD.    Baseline Pt currently ambulates with a SPC. Pt able to ambulate up to 300 ft without SPC (CGA) and without LOB 09/13/2016. Currently ambulates with SPC secondary to knee pain (11/28/2016). Able to ambulate up to 400 ft today without AD, no increase in knee or sciatic symptoms. Had to stop secondary to fatigue. LOB x 1 at end of 400 ft when turning, min A from PT to recover. (12/12/2016); Pt was able to ambulate 500 ft without AD, no LOB (02/06/2017)   Time 4   Period Weeks   Status Achieved     PT LONG TERM GOAL #6   Title Patient will be able to drive to church (pt personal goal).    Baseline Pt currently unable to drive to church due to R sciatic pain.    Time 3   Period Weeks   Status On-going               Plan - 02/19/17 1557    Clinical Impression Statement Improved patellar mobility following manual therapy. Ended with NuStep machine to continue movement and promote carryover. Pt was recommended to try to find a gym that has the NuStep machine to help continue her progress once she is finished with PT. Worked on standing balance related activities to help decrease fall risk at home.    Rehab Potential Good   Clinical Impairments Affecting Rehab Potential Chronicity of condition, age   PT Frequency 1x / week   1-2x/week   PT Duration 3 weeks   PT Treatment/Interventions Therapeutic exercise;Manual techniques;Aquatic Therapy;Ultrasound;Advice worker;Therapeutic activities;Patient/family education;Neuromuscular re-education;Dry needling   PT Next Visit Plan femoral control, mechanics with stair negotiation, movement, hip strengthening, HEP   Consulted and Agree with Plan of Care Patient      Patient will benefit from skilled therapeutic intervention in order to improve the following deficits and impairments:  Pain, Improper body mechanics, Decreased strength  Visit Diagnosis: Difficulty in walking, not elsewhere classified  Muscle weakness (generalized)  Sciatica, right side  Chronic pain of right knee  Chronic pain of left knee     Problem List There are no active problems to display for this patient.   Joneen Boers PT, DPT   02/19/2017, 7:31 PM  [facility] [place] [address], [place], [address] Phone: [phone]   Fax:  [phone]  Name: Andrea Hester MRN: [id number] Date of Birth: Mar 29, 1933

## 2017-02-28 ENCOUNTER — Ambulatory Visit: Payer: Medicare Other

## 2017-02-28 DIAGNOSIS — M25561 Pain in right knee: Secondary | ICD-10-CM

## 2017-02-28 DIAGNOSIS — M5431 Sciatica, right side: Secondary | ICD-10-CM

## 2017-02-28 DIAGNOSIS — G8929 Other chronic pain: Secondary | ICD-10-CM

## 2017-02-28 DIAGNOSIS — M6281 Muscle weakness (generalized): Secondary | ICD-10-CM

## 2017-02-28 DIAGNOSIS — R262 Difficulty in walking, not elsewhere classified: Secondary | ICD-10-CM

## 2017-02-28 DIAGNOSIS — M25562 Pain in left knee: Secondary | ICD-10-CM

## 2017-02-28 NOTE — Therapy (Signed)
[facility] [address], [place], [address] Phone: [phone]   Fax:  [phone]  Physical Therapy Treatment And Discharge Summary  Patient Details  Name: Andrea Hester MRN: [id number] Date of Birth: 06-29-1933 Referring Provider: Frazier Richards III, MD  Encounter Date: 02/28/2017      PT End of Session - 02/28/17 1301    Visit Number 68   Number of Visits 41   Date for PT Re-Evaluation 03/08/17   Authorization Type 3   Authorization Time Period of 10   PT Start Time 1301   PT Stop Time 1346   PT Time Calculation (min) 45 min   Activity Tolerance Patient tolerated treatment well   Behavior During Therapy [facility] for tasks assessed/performed      Past Medical History:  Diagnosis Date  . Allergy   . GERD (gastroesophageal reflux disease)   . H/O Clostridium difficile infection   . History of palpitations   . Hyperlipidemia   . Migraines   . White coat hypertension     Past Surgical History:  Procedure Laterality Date  . GALLBLADDER SURGERY    . TONSILLECTOMY      There were no vitals filed for this visit.      Subjective Assessment - 02/28/17 1303    Subjective R sciatic is good. The HEP helps. 0/10 sciatic pain currently. Felt good after last session until Friday last week, even when she got up, the stiffness did not bother her that much. Started bothering her during the weekend after doing chores a lot.  0/10 L knee, 3.5 R knee pain currently (5/10 R knee pain at worst for the past 7 days, on Friday, other than that, its a 3.5/10 at most).  3/10 L knee pain at most for the past 7 days. 2.5/10 R sciatic pain at most for the past 7 days.  Has not yet driven to church yet.  Found a gym with a NuStep machine and thinking about joining it.   Pt wants to try her HEP at home and see how it goes. Will come back if she needs more PT.    Pertinent History Bilateral knee pain. Pain began about 4  years ago when pt was going up and down the stairs. Used biofreeze which helped for about a year. 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 Pt states that her knees progressively worsened. Pt also has difficulty going up and down stairs to do laundry due to her knee pain.  Has not had any imaging for her knees.  Pt also states she might have pulled a muscle behind her R thigh from a balance exercise (standing and leaning back) that she did.    Patient Stated Goals Be able to get out and get to church. Walk better. Pt adds that she wants to get rid of her cane.    Currently in Pain? Yes   Pain Onset More than a month ago                                 PT Education - 02/28/17 1332    Education provided Yes   Education Details ther-ex, plan of care: DC and continue with HEP.    Person(s) Educated Patient   Methods Explanation;Demonstration;Tactile cues;Verbal cues   Comprehension Returned demonstration;Verbalized understanding  Objective  Time taken to listen to pt subjective reports. Pt has not yet been able to drive to church.      Manual therapy  Medial glide to R patella grade 3 in sitting, knee flexed STM R vastus lateralis    No change in R knee symptoms.    There-ex   Side stepping 5 ft to the R and 5 ft to the L 6x. No UE assist   Standing alternating toe taps onto treadmill platform 10x3 each LE with light touch assist  Standing marches with light touch assist 10x2 each LE  Seated hip adduction ball squeeze with glute max squeeze 10x5 seconds  Ascending and descending 4 regular steps with bilateral UE assist 3x. Medium level knee pain and stiffness.   NuStep at end of session (no charge) x 5 min. Seat 8, arms 8, level 1 to promote movement at knees   Improved exercise technique, movement at target joints, use of target muscles after min to mod verbal, visual,  tactile cues.     Pt demonstrates overall improved R sciatic, and bilateral knee pain since starting PT, with improved carry over of decreased pain and stiffness with addition of using the NuStep machine. Pt aslo demonstrates independence and consistency with HEP and found a gym which she is thinking about joining that has a NuStep machine. Skilled physical therapy services discharged with pt continuing progress with her exercises at home and at the gym. Pt to return to formal PT if needed.        PT Long Term Goals - 02/28/17 1835      PT LONG TERM GOAL #1   Title Patient will have a decrease in R knee pain to 3/10 or less at worst and L knee pain to 2/10 or less at worst to promote ability to stand up from a chair, ascend and descend stairs, and pick up items from the ground.    Baseline Pain at worst: R knee 7/10, L knee 5/10; 6/10 R knee pain at worst, 5/10 L knee pain at worst (06/29/2016 and 07/27/2016); L knee pain 2/10 at worst,  4.5/10  R knee pain at most for the past 7 days (not counting the morning stiffness) 08/29/2016.    For the past 7 days: 5.5/10 R knee, 4/10 L knee, (not counting the morning stiffness) and 6/10 R gastroc at most (09/13/2016);  For the past 7 days: 6-7/10 R knee, 4-5/10 L knee, 8/10 R gastroc/sciatic pain at most (10/19/2016);  For the past 7 days: 4-5/10 R knee, 3/10 L knee, 4-5/10 R sciatic pain at most for the past 7 days (11/13/2016).    3-4/10 R knee pain, 9/10 L knee pain, 3-4/10 R sciatic pain at most for the past 7 days (11/28/2016);  4/10 R knee, 3/10 L knee, 4.5/10 R sciatic pain at most for the past 7 days (12/12/2016);   Pain at worst after morning stiffness: R 3.5/10, L knee 2.5/10, R sciatic 3.5/10 (01/01/2017);  3.5-4/10 R sciatic pain, 3.02/09/09 R knee pain (after getting her stiffness out), 2.5 to 3/10 at most (after getting her stiffness out) at most for the past 7 days 02/14/2017.  Pain at most for the past 7 days: 2.5/10 R sciatic, 5/10 R knee pain (last  Friday, other than that it was 3.5/10 at most), 3/10 L knee pain (02/28/2017)   Time 3   Period Weeks   Status On-going     PT LONG TERM GOAL #2   Title Patient  will improve her LEFS score by at least 9 points as a demonstration of improved function.    Baseline 26/80; 29/80 (07/25/2016); 33/80 (08/29/2016); 40/80 (10/19/2016)   Time 6   Period Weeks   Status Achieved     PT LONG TERM GOAL #3   Title Patient will improve bilateral hip abduction and knee flexion strength by at least 1/2 MMT grade to promote ability to negotiate stairs and pick up items from the floor.    Time 6   Period Weeks   Status Achieved     PT LONG TERM GOAL #4   Title Patient will be able to ascend and descend 4 regular steps 3 times with bilateral UE assist with minimal to no complain of knee pain to promote ability to go up and down her stairs at home.    Baseline Increased bilateral knee pain with stair negotiation. Able to ascend and descend 4 regular steps with bilateral UE assist mod I with improved femoral control and without knee pain (after manual therapy). Able to perform today (after hip exercises) with minimal to no reports of knee pain with reciprocal gait pattern (08/29/2016).  L knee pain currently when weight bearing (11/28/2016). Bilateral knee pain R > L (01/01/2017); medium level knee pain and stiffness, reciprocal gait pattern (02/28/2017)   Time 3   Period Weeks   Status On-going     PT LONG TERM GOAL #5   Title Patient will be able to ambulate at least 500 ft without LOB and without use of AD.    Baseline Pt currently ambulates with a SPC. Pt able to ambulate up to 300 ft without SPC (CGA) and without LOB 09/13/2016. Currently ambulates with SPC secondary to knee pain (11/28/2016). Able to ambulate up to 400 ft today without AD, no increase in knee or sciatic symptoms. Had to stop secondary to fatigue. LOB x 1 at end of 400 ft when turning, min A from PT to recover. (12/12/2016); Pt was able to ambulate  500 ft without AD, no LOB (02/06/2017)   Time 4   Period Weeks   Status Achieved     PT LONG TERM GOAL #6   Title Patient will be able to drive to church (pt personal goal).    Baseline Pt currently unable to drive to church due to R sciatic pain. Pt has not yet driven to church (03/15/3709)   Time 3   Period Weeks   Status On-going               Plan - 02/28/17 1259    Clinical Impression Statement Pt demonstrates overall improved R sciatic, and bilateral knee pain since starting PT, with improved carry over of decreased pain and stiffness with addition of using the NuStep machine. Pt aslo demonstrates independence and consistency with HEP and found a gym which she is thinking about joining that has a NuStep machine. Skilled physical therapy services discharged with pt continuing progress with her exercises at home and at the gym. Pt to return to formal PT if needed.    Rehab Potential Good   Clinical Impairments Affecting Rehab Potential Chronicity of condition, age   PT Frequency --   PT Duration --   PT Treatment/Interventions Therapeutic exercise;Manual techniques;Electrical Stimulation;Therapeutic activities;Patient/family education;Neuromuscular re-education   PT Next Visit Plan Continue progress with HEP and NuStep machine at a gym.    Consulted and Agree with Plan of Care Patient      Patient will benefit from skilled  therapeutic intervention in order to improve the following deficits and impairments:  Pain, Improper body mechanics, Decreased strength  Visit Diagnosis: Sciatica, right side  Difficulty in walking, not elsewhere classified  Muscle weakness (generalized)  Chronic pain of right knee  Chronic pain of left knee       G-Codes - Mar 25, 2017 1839    Functional Assessment Tool Used (Outpatient Only) LEFS, clinical presentation, patient interview.   Functional Limitation Mobility: Walking and moving around   Mobility: Walking and Moving Around Goal Status  [phone]) At least 20 percent but less than 40 percent impaired, limited or restricted   Mobility: Walking and Moving Around Discharge Status [phone]) At least 40 percent but less than 60 percent impaired, limited or restricted      Problem List There are no active problems to display for this patient.  Thank you for your referral.   Joneen Boers PT, DPT   03-25-2017, 6:45 PM  [facility] [address], [place], [address] Phone: [phone]   Fax:  [phone]  Name: Andrea Hester MRN: [id number] Date of Birth: March 22, 1933

## 2017-03-19 ENCOUNTER — Ambulatory Visit: Payer: Medicare Other

## 2018-07-17 DIAGNOSIS — Z9989 Dependence on other enabling machines and devices: Secondary | ICD-10-CM | POA: Insufficient documentation

## 2018-07-17 DIAGNOSIS — Z789 Other specified health status: Secondary | ICD-10-CM | POA: Insufficient documentation

## 2018-08-05 ENCOUNTER — Encounter: Payer: Self-pay | Admitting: Urology

## 2018-08-05 ENCOUNTER — Ambulatory Visit: Payer: Medicare Other | Admitting: Urology

## 2018-08-05 VITALS — BP 177/87 | HR 90 | Ht 66.0 in | Wt 146.6 lb

## 2018-08-05 DIAGNOSIS — E785 Hyperlipidemia, unspecified: Secondary | ICD-10-CM | POA: Insufficient documentation

## 2018-08-05 DIAGNOSIS — N362 Urethral caruncle: Secondary | ICD-10-CM

## 2018-08-05 DIAGNOSIS — D369 Benign neoplasm, unspecified site: Secondary | ICD-10-CM | POA: Insufficient documentation

## 2018-08-05 DIAGNOSIS — K219 Gastro-esophageal reflux disease without esophagitis: Secondary | ICD-10-CM | POA: Insufficient documentation

## 2018-08-05 DIAGNOSIS — J309 Allergic rhinitis, unspecified: Secondary | ICD-10-CM | POA: Insufficient documentation

## 2018-08-05 DIAGNOSIS — R310 Gross hematuria: Secondary | ICD-10-CM

## 2018-08-05 DIAGNOSIS — Z87898 Personal history of other specified conditions: Secondary | ICD-10-CM | POA: Insufficient documentation

## 2018-08-05 DIAGNOSIS — G43909 Migraine, unspecified, not intractable, without status migrainosus: Secondary | ICD-10-CM | POA: Insufficient documentation

## 2018-08-05 LAB — MICROSCOPIC EXAMINATION
Bacteria, UA: NONE SEEN
RBC, UA: NONE SEEN /hpf (ref 0–2)

## 2018-08-05 LAB — URINALYSIS, COMPLETE
BILIRUBIN UA: NEGATIVE
Glucose, UA: NEGATIVE
Ketones, UA: NEGATIVE
Nitrite, UA: NEGATIVE
PH UA: 5.5 (ref 5.0–7.5)
PROTEIN UA: NEGATIVE
RBC UA: NEGATIVE
SPEC GRAV UA: 1.015 (ref 1.005–1.030)
UUROB: 0.2 mg/dL (ref 0.2–1.0)

## 2018-08-05 MED ORDER — ESTRADIOL 0.1 MG/GM VA CREA: TOPICAL_CREAM | VAGINAL | 12 refills | Status: DC

## 2018-08-05 MED ORDER — ESTROGENS, CONJUGATED 0.625 MG/GM VA CREA: TOPICAL_CREAM | VAGINAL | 12 refills | Status: DC

## 2018-08-05 NOTE — Patient Instructions (Signed)
I have given you two prescriptions for a vaginal estrogen cream.  Estrace and Premarin.  Please take these to your pharmacy and see which one your insurance covers.  If both are too expensive, please call the office at [phone] for an alternative.  You are given a sample of vaginal estrogen cream Premarin and instructed to apply 0.5mg (pea-sized amount)  just inside the vaginal introitus with a finger-tip on Monday, Wednesday and Friday nights,     

## 2018-08-05 NOTE — Progress Notes (Signed)
08/05/2018 2:56 PM   Andrea Hester [birth year] [id number]  Referring provider: Kirk Ruths, MD [facility] [place], [place] [address]  Chief Complaint  Patient presents with  . Hematuria    HPI: Patient is a 84 -year-old Caucasian female who presents today as a referral from [facility], [place] for gross hematuria and urethral polyp.    She presented to her PCP on 07/29/2018 with the complaint of a spot of blood on her underwear.  Pelvic exam revealed vaginal atrophy and an erythematous urethral polyp.    She does not have a prior history of recurrent urinary tract infections, nephrolithiasis, trauma to the genitourinary tract or malignancies of the genitourinary tract.   She does not have a family medical history of nephrolithiasis, malignancies of the genitourinary tract or hematuria.   Today, she is having symptoms of nocturia.  Patient denies any gross hematuria, dysuria or suprapubic/flank pain.  Patient denies any fevers, chills, nausea or vomiting.  Her UA today is bland.   She is not a smoker.    She was exposed to secondhand smoke as a child.  They have/have not worked with Sports administrator, trichloroethylene, etc.   She has HTN.      PMH: Past Medical History:  Diagnosis Date  . Allergy   . Anxiety   . Arrhythmia   . Arthritis   . Asthma   . GERD (gastroesophageal reflux disease)   . Glaucoma   . H/O Clostridium difficile infection   . High cholesterol   . History of palpitations   . Hyperlipidemia   . Migraines   . White coat hypertension     Surgical History: Past Surgical History:  Procedure Laterality Date  . ABDOMINAL HYSTERECTOMY    . GALLBLADDER SURGERY    . TONSILLECTOMY      Home Medications:  Allergies as of 08/05/2018      Reactions   Prednisone Shortness Of Breath   Cardizem [diltiazem Hcl] Other (See Comments)   Could take lower doses, higher doses lower blood pressure too much     Ciprofloxacin Other (See Comments)   Unsure of reaction   Clindamycin/lincomycin Other (See Comments)   Knee pain   Macrolides And Ketolides Other (See Comments)   Allergic vasculitis   Omnicef [cefdinir] Diarrhea   Penicillins Other (See Comments)   Told she was allergic after an allergic test   Prilosec [omeprazole] Other (See Comments)   migraine   Reglan [metoclopramide] Other (See Comments)   Neck pain   Statins Other (See Comments)   Muscle cramps   Sulfa Antibiotics Other (See Comments)   vasculitis   Epinephrine Palpitations   Latex Rash      Medication List        Accurate as of 08/05/18  2:56 PM. Always use your most recent med list.          ASPIRIN-CALCIUM CARBONATE PO Take by mouth.   clindamycin 300 MG capsule Commonly known as:  CLEOCIN Take 1 capsule by mouth 3 (three) times daily.   conjugated estrogens vaginal cream Commonly known as:  PREMARIN Apply 0.5mg  (pea-sized amount)  just inside the vaginal introitus with a finger-tip on  Monday, Wednesday and Friday nights.   diltiazem 30 MG tablet Commonly known as:  CARDIZEM Take 1 tablet by mouth daily.   estradiol 0.1 MG/GM vaginal cream Commonly known as:  ESTRACE Apply 0.5mg  (pea-sized amount)  just inside the vaginal introitus  with a finger-tip on Monday, Wednesday and Friday nights.   furosemide 20 MG tablet Commonly known as:  LASIX Take 1 tablet by mouth daily.   hydrochlorothiazide 12.5 MG tablet Commonly known as:  HYDRODIURIL Take 12.5 mg by mouth daily.   ibuprofen 200 MG tablet Commonly known as:  ADVIL,MOTRIN Take 200 mg by mouth 2 (two) times daily.   metroNIDAZOLE 500 MG tablet Commonly known as:  FLAGYL Take 500 mg by mouth 3 (three) times daily.   potassium chloride 10 MEQ tablet Commonly known as:  K-DUR Take 1 tablet by mouth daily.       Allergies:  Allergies  Allergen Reactions  . Prednisone Shortness Of Breath  . Cardizem [Diltiazem Hcl] Other (See  Comments)    Could take lower doses, higher doses lower blood pressure too much  . Ciprofloxacin Other (See Comments)    Unsure of reaction  . Clindamycin/Lincomycin Other (See Comments)    Knee pain  . Macrolides And Ketolides Other (See Comments)    Allergic vasculitis  . Omnicef [Cefdinir] Diarrhea  . Penicillins Other (See Comments)    Told she was allergic after an allergic test  . Prilosec [Omeprazole] Other (See Comments)    migraine  . Reglan [Metoclopramide] Other (See Comments)    Neck pain  . Statins Other (See Comments)    Muscle cramps  . Sulfa Antibiotics Other (See Comments)    vasculitis  . Epinephrine Palpitations  . Latex Rash    Family History: Family History  Problem Relation Age of Onset  . Liver cancer Mother     Social History:  reports that she has never smoked. She has never used smokeless tobacco. She reports that she does not drink alcohol or use drugs.  ROS: UROLOGY Frequent Urination?: No Hard to postpone urination?: No Burning/pain with urination?: No Get up at night to urinate?: Yes Leakage of urine?: No Urine stream starts and stops?: No Trouble starting stream?: No Do you have to strain to urinate?: No Blood in urine?: No Urinary tract infection?: No Sexually transmitted disease?: No Injury to kidneys or bladder?: No Painful intercourse?: No Weak stream?: No Currently pregnant?: No Vaginal bleeding?: Yes Last menstrual period?: n  Gastrointestinal Nausea?: No Vomiting?: No Indigestion/heartburn?: Yes Diarrhea?: No Constipation?: Yes  Constitutional Fever: No Night sweats?: No Weight loss?: No Fatigue?: No  Skin Skin rash/lesions?: Yes Itching?: No  Eyes Blurred vision?: Yes Double vision?: No  Ears/Nose/Throat Sore throat?: No Sinus problems?: No  Hematologic/Lymphatic Swollen glands?: No Easy bruising?: Yes  Cardiovascular Leg swelling?: No Chest pain?: No  Respiratory Cough?: No Shortness of  breath?: No  Endocrine Excessive thirst?: No  Musculoskeletal Back pain?: No Joint pain?: Yes  Neurological Headaches?: No Dizziness?: Yes  Psychologic Depression?: No Anxiety?: No  Physical Exam: BP (!) 177/87 (BP Location: Left Arm, Patient Position: Sitting, Cuff Size: Normal)   Pulse 90   Ht 5\' 6"  (1.676 m)   Wt 146 lb 9.6 oz (66.5 kg)   BMI 23.66 kg/m   Constitutional:  Well nourished. Alert and oriented, No acute distress. HEENT: [place] AT, moist mucus membranes.  Trachea midline, no masses. Cardiovascular: No clubbing, cyanosis, or edema. Respiratory: Normal respiratory effort, no increased work of breathing. GI: Abdomen is soft, non tender, non distended, no abdominal masses. Liver and spleen not palpable.  No hernias appreciated.  Stool sample for occult testing is not indicated.   GU: No CVA tenderness.  No bladder fullness or masses.  Atrophic external genitalia,  normal pubic hair distribution, no lesions.  Normal urethral meatus, no lesions, no prolapse, no discharge.   Erythematous lesion located in the meatus.  No bladder fullness, tenderness or masses. Pale vagina mucosa, poor estrogen effect, no discharge, no lesions, poor pelvic support, grade II cystocele and no rectocele noted.  Cervix, uterus and adnexa are surgically absent Anus and perineum are without rashes or lesions.    Skin: No rashes, bruises or suspicious lesions. Lymph: No cervical or inguinal adenopathy. Neurologic: Grossly intact, no focal deficits, moving all 4 extremities. Psychiatric: Normal mood and affect.  Laboratory Data: Lab Results  Component Value Date   WBC 9.7 07/09/2015   HGB 13.9 07/09/2015   HCT 40.9 07/09/2015   MCV 92.5 07/09/2015   PLT 257 07/09/2015    Lab Results  Component Value Date   CREATININE 0.82 07/09/2015    No results found for: PSA  No results found for: TESTOSTERONE  No results found for: HGBA1C  No results found for: TSH  No results found for: CHOL,  HDL, CHOLHDL, VLDL, LDLCALC  Lab Results  Component Value Date   AST 25 07/09/2015   Lab Results  Component Value Date   ALT 23 07/09/2015   No components found for: ALKALINEPHOPHATASE No components found for: BILIRUBINTOTAL  No results found for: ESTRADIOL   Urinalysis Bland.  See Epic.  I have reviewed the labs.  Assessment & Plan:    1. Gross hematuria Explained to the patient that there are a number of causes that can be associated with blood in the urine, such as stones,  UTI's, damage to the urinary tract and/or cancer. At this time, I felt that the patient warranted further urologic evaluation.   The AUA guidelines state that a CT urogram is the preferred imaging study to evaluate hematuria - she has an allergic reaction to contrast and would like not to have contrast We will obtain a RUS with the understanding that it may miss some urological tumors Following the imaging study,  I've recommended a cystoscopy. I described how this is performed, typically in an office setting with a flexible cystoscope. We described the risks, benefits, and possible side effects, the most common of which is a minor amount of blood in the urine and/or burning which usually resolves in 24 to 48 hours.   The patient had the opportunity to ask questions which were answered. Based upon this discussion, the patient is willing to proceed. Therefore, I've ordered: a RUS and cystoscopy.  The patient will return following all of the above for discussion of the results.  UA  Urine culture  BUN + creatinine    2. Urethral caruncle Explained to the patient that she should undergo a cystoscopy as she had gross hematuria and caruncles do not typically bleed, so we need to evaluate her for a urethral/bladder cancer I have explained to the patient that they will  be scheduled for a cystoscopy in our office to evaluate their bladder.  The cystoscopy consists of passing a tube with a lens up through their  urethra and into their urinary bladder.   We will inject the urethra with a lidocaine gel prior to introducing the cystoscope to help with any discomfort during the procedure.   After the procedure, they might experience blood in the urine and discomfort with urination.  This will abate after the first few voids.  I have  encouraged the patient to increase water intake  during this time.  Patient denies any  allergies to lidocaine.  We will get her started on vaginal estrogen cream in the interim, she will apply a pea-sized amount with her fingertip to the caruncle on Monday and Wednesday and Friday nights I have given her prescription for Premarin and Estrace so that she can see which brand-name her insurance will cover  Return for RUS report and cysto .  These notes generated with voice recognition software. I apologize for typographical errors.  Zara Council, PA-C  [facility] [address] [place]  [place], [place] [address] [phone]

## 2018-08-06 LAB — BUN+CREAT
BUN/Creatinine Ratio: 34 — ABNORMAL HIGH (ref 12–28)
BUN: 24 mg/dL (ref 8–27)
CREATININE: 0.71 mg/dL (ref 0.57–1.00)
GFR calc Af Amer: 91 mL/min/{1.73_m2} (ref >59)
GFR calc non Af Amer: 79 mL/min/{1.73_m2} (ref >59)

## 2018-08-07 LAB — CULTURE, URINE COMPREHENSIVE

## 2018-08-09 ENCOUNTER — Telehealth: Payer: Self-pay | Admitting: Urology

## 2018-08-09 NOTE — Telephone Encounter (Signed)
Pt calling asking about her blood and urine results done earlier this week.  Please advise. Thank you.

## 2018-08-09 NOTE — Telephone Encounter (Signed)
Blood work is normal.  Urine culture is not back yet.

## 2018-08-12 NOTE — Telephone Encounter (Signed)
Patient notified and voiced understanding.

## 2018-08-16 ENCOUNTER — Telehealth: Payer: Self-pay | Admitting: Urology

## 2018-08-16 NOTE — Telephone Encounter (Signed)
Called pt informed her of negative urine cx. Pt gave verbal understanding.  

## 2018-08-16 NOTE — Telephone Encounter (Signed)
Pt calling asking about her urine results, Please call pt [phone]. Thank you.

## 2018-08-29 ENCOUNTER — Ambulatory Visit
Admission: RE | Admit: 2018-08-29 | Discharge: 2018-08-29 | Disposition: A | Discharge status: Home / Self Care | Payer: Medicare Other | Source: Ambulatory Visit | Attending: Urology | Admitting: Urology

## 2018-08-29 DIAGNOSIS — N281 Cyst of kidney, acquired: Secondary | ICD-10-CM | POA: Insufficient documentation

## 2018-08-29 DIAGNOSIS — R310 Gross hematuria: Secondary | ICD-10-CM | POA: Diagnosis not present

## 2018-09-03 ENCOUNTER — Ambulatory Visit (INDEPENDENT_AMBULATORY_CARE_PROVIDER_SITE_OTHER): Payer: Medicare Other | Admitting: Urology

## 2018-09-03 ENCOUNTER — Encounter: Payer: Self-pay | Admitting: Urology

## 2018-09-03 VITALS — BP 202/81 | HR 98 | Ht 66.0 in | Wt 143.0 lb

## 2018-09-03 DIAGNOSIS — N362 Urethral caruncle: Secondary | ICD-10-CM

## 2018-09-03 DIAGNOSIS — R310 Gross hematuria: Secondary | ICD-10-CM

## 2018-09-03 DIAGNOSIS — N281 Cyst of kidney, acquired: Secondary | ICD-10-CM

## 2018-09-03 LAB — URINALYSIS, COMPLETE
Bilirubin, UA: NEGATIVE
Glucose, UA: NEGATIVE
Ketones, UA: NEGATIVE
NITRITE UA: NEGATIVE
PH UA: 5.5 (ref 5.0–7.5)
Protein, UA: NEGATIVE
Specific Gravity, UA: 1.01 (ref 1.005–1.030)
Urobilinogen, Ur: 0.2 mg/dL (ref 0.2–1.0)

## 2018-09-03 LAB — MICROSCOPIC EXAMINATION

## 2018-09-03 NOTE — Addendum Note (Signed)
Addended by: Tommy Rainwater on: 09/03/2018 03:04 PM   Modules accepted: Orders

## 2018-09-03 NOTE — Progress Notes (Signed)
   09/03/18  CC:  Chief Complaint  Patient presents with  . Cysto    HPI: 84 year old female with blood on toilet tissue after wiping who presents today for cystoscopy.  She underwent renal ultrasound for further evaluation with incidental right upper pole complex renal cyst measuring 13 mm.  She denies any flank pain.  She was prescribed topical estrogen cream for irritation of urethral carbuncle.  Since initiation of this medication, she reports irritation and itching.   NED. A&Ox3.   No respiratory distress   Abd soft, NT, ND Normal external genitalia with patent urethral meatus  Cystoscopy Procedure Note  Patient identification was confirmed, informed consent was obtained, and patient was prepped using Betadine solution.  Lidocaine jelly was administered per urethral meatus.    Procedure: - Flexible cystoscope introduced, without any difficulty.   - Thorough search of the bladder revealed:    normal urethral meatus    normal urothelium    no stones    no ulcers     no tumors    no urethral polyps    mild trabeculation  - Ureteral orifices were normal in position and appearance.  There is a small amount of inflammation around the bladder neck, trigonitis on retroflexion.  Post-Procedure: - Patient tolerated the procedure well  Assessment/ Plan:  1. Gross hematuria Given the nature of her blood on the toilet tissue, I suspect this is related to irritation of her urethral carbuncle Cystoscopy today unremarkable Renal ultrasound reassuring as below - Urinalysis, Complete  2. Urethral caruncle Itching with topical estrogen cream, otherwise asymptomatic from urethral caruncle As such, recommend discontinuing this medication  3. Renal cyst Today and a 13 mm right upper pole renal cyst Recommend surveillance with renal ultrasound in 6 months - US RENAL; Future   Return in about 6 months (around 03/04/2019) for RUS (shannon).  Hollice Espy, MD

## 2019-02-28 ENCOUNTER — Ambulatory Visit: Payer: Medicare Other

## 2019-03-05 ENCOUNTER — Telehealth: Payer: Medicare Other | Admitting: Urology

## 2019-03-05 ENCOUNTER — Ambulatory Visit: Payer: Medicare Other | Admitting: Urology

## 2019-03-06 ENCOUNTER — Ambulatory Visit: Payer: Medicare Other | Admitting: Urology

## 2019-04-01 ENCOUNTER — Ambulatory Visit
Admission: RE | Admit: 2019-04-01 | Discharge: 2019-04-01 | Disposition: A | Discharge status: Home / Self Care | Payer: Medicare Other | Source: Ambulatory Visit | Attending: Urology | Admitting: Urology

## 2019-04-01 ENCOUNTER — Other Ambulatory Visit: Payer: Self-pay

## 2019-04-01 DIAGNOSIS — N281 Cyst of kidney, acquired: Secondary | ICD-10-CM

## 2019-04-03 ENCOUNTER — Telehealth (INDEPENDENT_AMBULATORY_CARE_PROVIDER_SITE_OTHER): Payer: Medicare Other | Admitting: Urology

## 2019-04-03 ENCOUNTER — Telehealth: Payer: Self-pay | Admitting: Urology

## 2019-04-03 ENCOUNTER — Other Ambulatory Visit: Payer: Self-pay | Admitting: Urology

## 2019-04-03 ENCOUNTER — Other Ambulatory Visit: Payer: Self-pay

## 2019-04-03 DIAGNOSIS — N281 Cyst of kidney, acquired: Secondary | ICD-10-CM | POA: Diagnosis not present

## 2019-04-03 DIAGNOSIS — Z87448 Personal history of other diseases of urinary system: Secondary | ICD-10-CM

## 2019-04-03 DIAGNOSIS — N362 Urethral caruncle: Secondary | ICD-10-CM

## 2019-04-03 NOTE — Telephone Encounter (Signed)
Would you schedule Andrea Hester for a one year follow up for RUS, UA and exam?

## 2019-04-03 NOTE — Progress Notes (Signed)
Virtual Visit via Telephone Note  I connected with Andrea Hester on 04/03/19 at 10:30 AM EDT by telephone and verified that I am speaking with the correct person using two identifiers.  Location: Patient: Home Provider: Home   I discussed the limitations, risks, security and privacy concerns of performing an evaluation and management service by telephone and the availability of in person appointments. I also discussed with the patient that there may be a patient responsible charge related to this service. The patient expressed understanding and agreed to proceed.   History of Present Illness: Andrea Hester is an 85 year old female with history of hematuria, urethral caruncle and a complex renal cyst who is contacted today to review the results of her RUS.  History of hematuria - work up completed with RUS and cysto 08/2018 + findings for a right upper pole complex renal cyst measuring 13 mm.  No reports of gross hematuria.  Urethral caruncle - no further blood on toilet tissue and no further vaginal itching   Renal cyst - stable complex cyst in the upper pole of the right kidney measuring approximately 1.2 cm. Consider a 12-18 month follow-up ultrasound to confirm stability of this finding   Observations/Objective: CLINICAL DATA:  Renal cyst.  EXAM: RENAL / URINARY TRACT ULTRASOUND COMPLETE  COMPARISON:  August 29, 2018.  FINDINGS: Right Kidney:  Renal measurements: 9 x 4.1 x 5 cm = volume: 96.6 mL. There is a 0.8 x 1 x 1.2 cm septated cystic area in the upper pole of the right kidney. There is no hydronephrosis.  Left Kidney:  Renal measurements: 9.6 x 4.5 x 4.8 cm = volume: 107 mL. Echogenicity within normal limits. No mass or hydronephrosis visualized.  Bladder:  Appears normal for degree of bladder distention.  IMPRESSION: Stable complex cyst in the upper pole of the right kidney measuring approximately 1.2 cm. Consider a 12-18 month follow-up  ultrasound to confirm stability of this finding.   Electronically Signed   By: Constance Holster M.D.   On: 04/01/2019 23:54 I have independently reviewed the films and appreciate the right complex cyst.   Assessment and Plan:  1. Right complex renal cyst Cyst is stable  2. History of hematuria Hematuria work up completed in 08/2018 - findings positive for complex right renal cyst No report of gross hematuria  3. Urethral caruncle Not bothersome at this time  Follow Up Instructions:  Andrea Hester will return in one year for UA, RUS and exam.     I discussed the assessment and treatment plan with the patient. The patient was provided an opportunity to ask questions and all were answered. The patient agreed with the plan and demonstrated an understanding of the instructions.   The patient was advised to call back or seek an in-person evaluation if the symptoms worsen or if the condition fails to improve as anticipated.  I provided 7 minutes of non-face-to-face time during this encounter.   Zaquan Duffner, PA-C

## 2020-02-24 IMAGING — US US RENAL
1 series · 14 of 25 positions shown · non-contrast
Comparison: August 29, 2018.

CLINICAL DATA: Renal cyst.

EXAM:
RENAL / URINARY TRACT ULTRASOUND COMPLETE

[Series 1: us renal · 0.22mm/px · 14 of 42 slices shown]
[im 1/42]
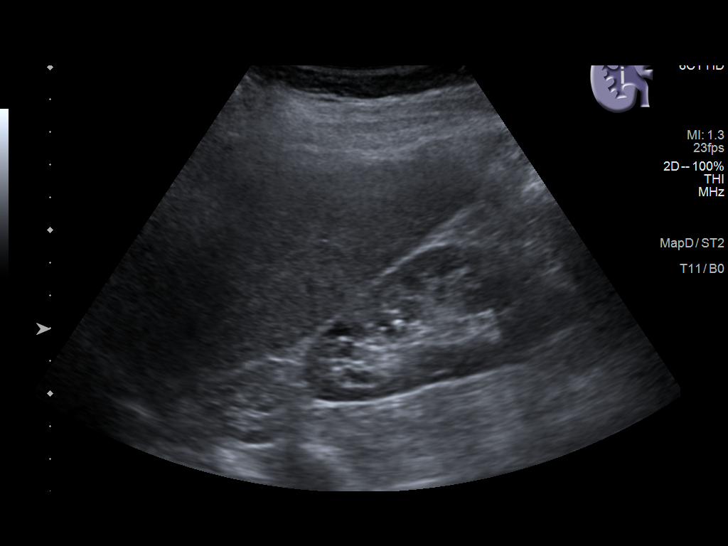
[im 4/42]
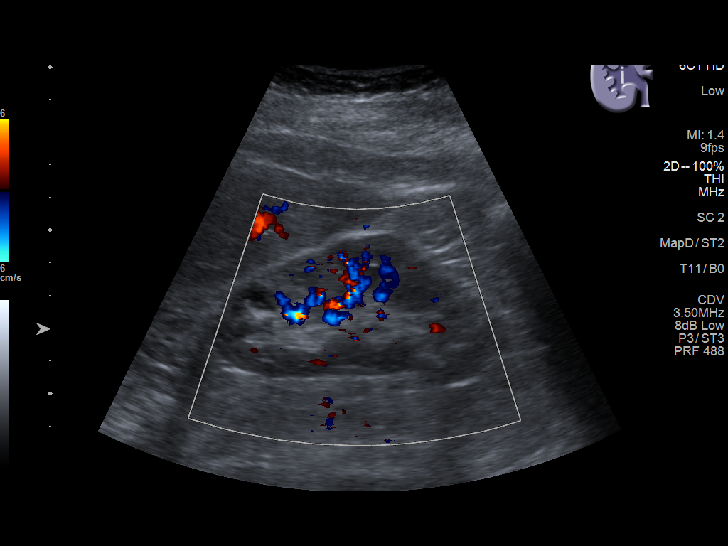
[im 7/42]
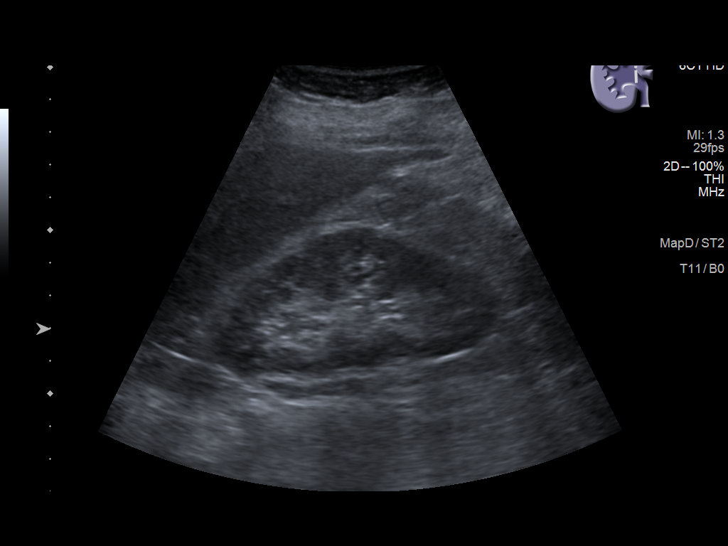
[im 11/42]
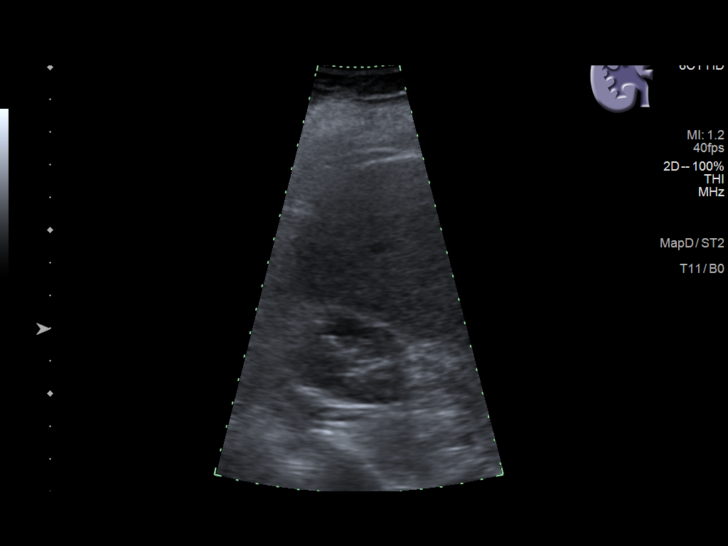
[im 14/42]
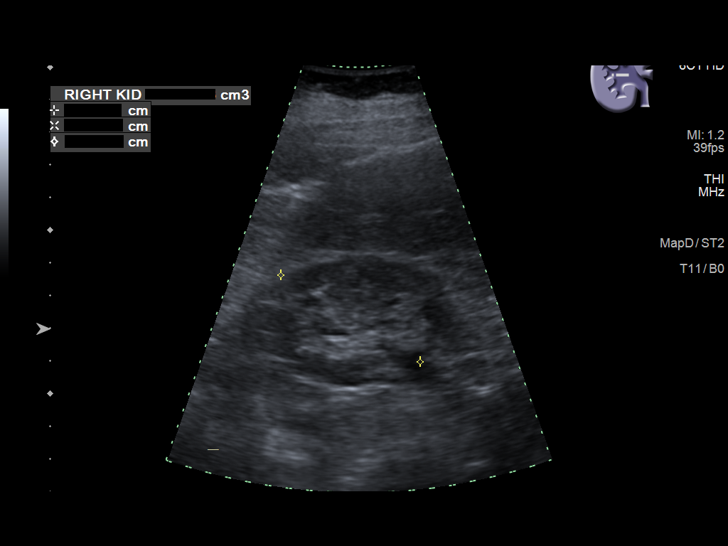
[im 16/42]
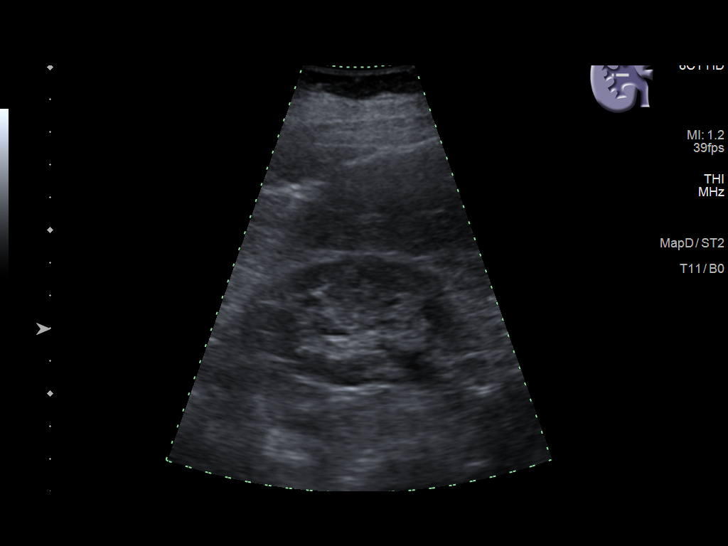
[im 19/42]
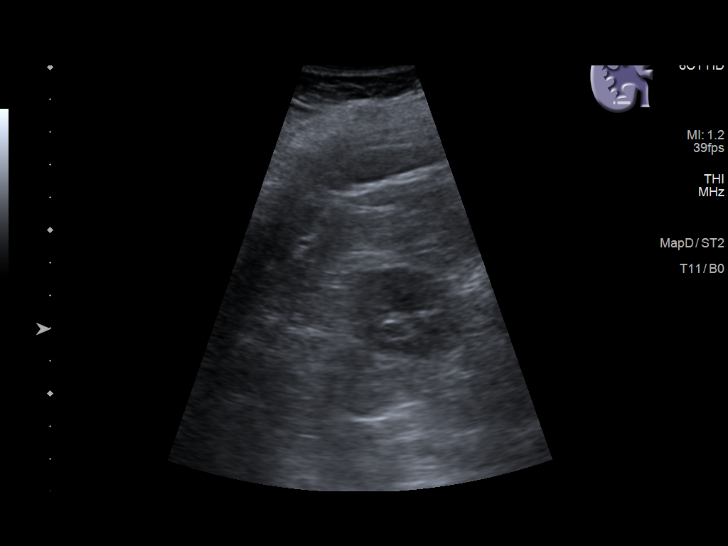
[im 23/42]
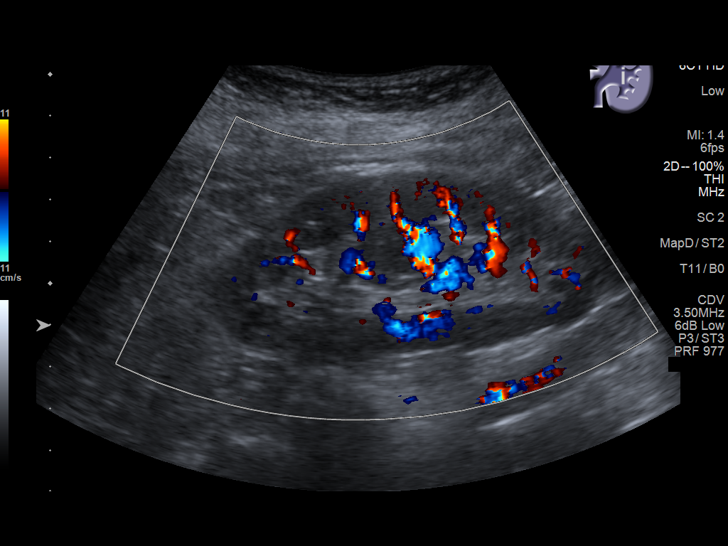
[im 26/42]
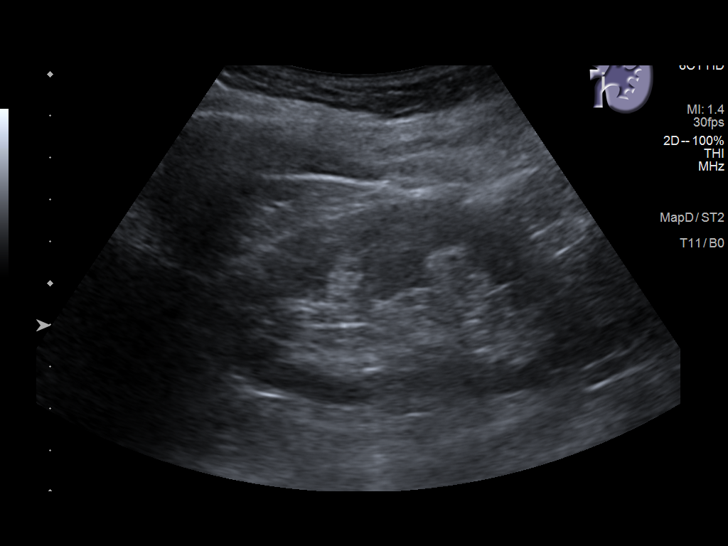
[im 28/42]
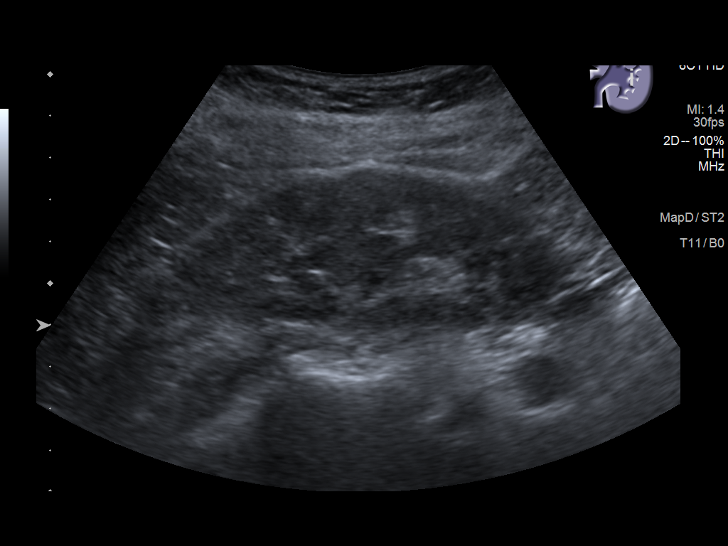
[im 31/42]
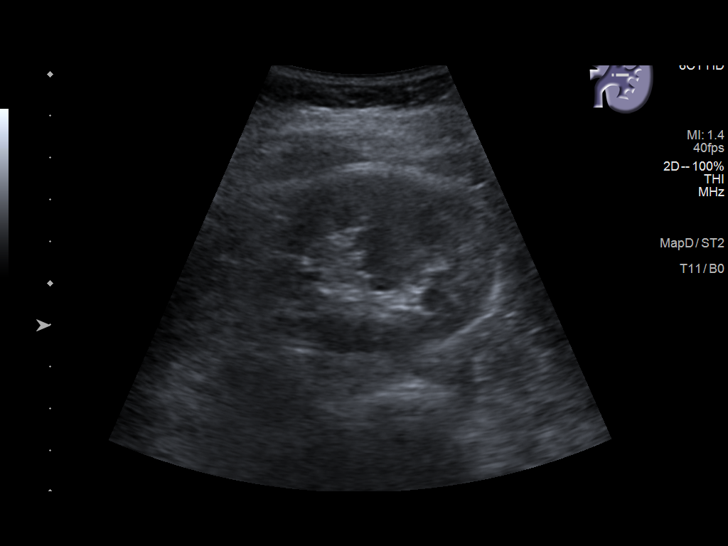
[im 35/42]
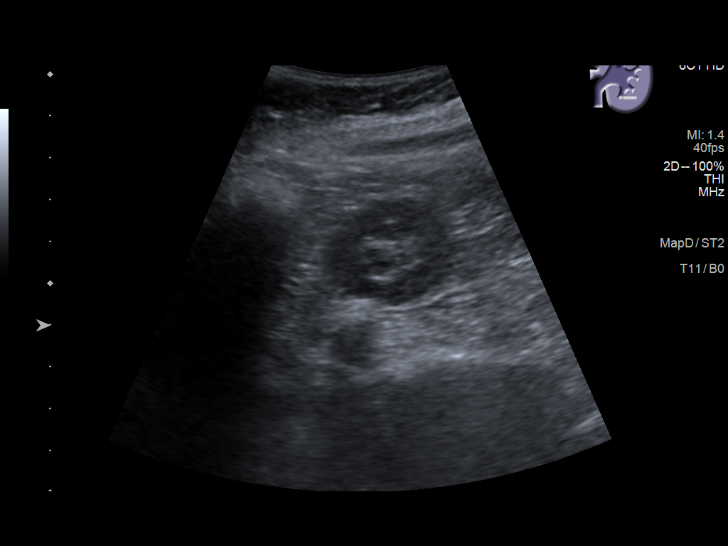
[im 38/42]
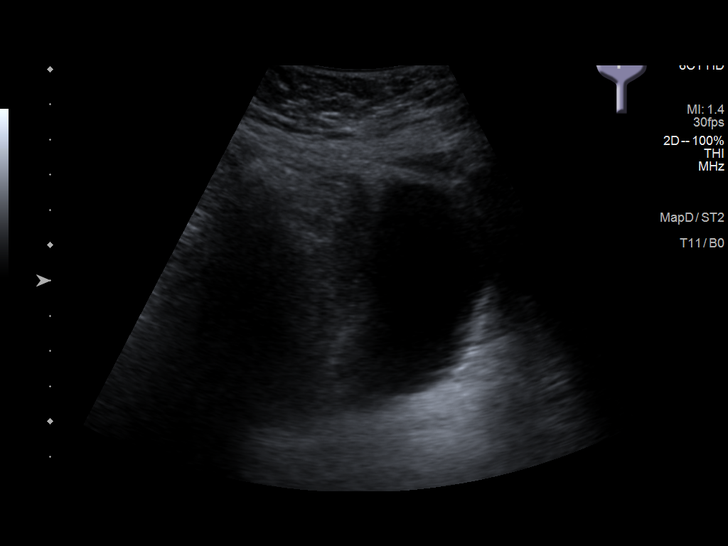
[im 42/42]
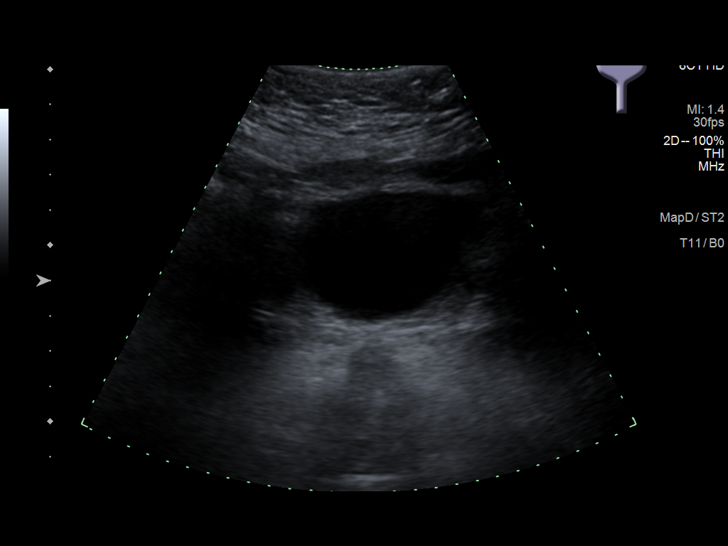

[14 of 25 positions shown; findings below may reference images not displayed]

FINDINGS: Right Kidney:

Renal measurements: 9 x 4.1 x 5 cm = volume: 96.6 mL. There is a
x 1 x 1.2 cm septated cystic area in the upper pole of the right
kidney. There is no hydronephrosis.

Left Kidney:

Renal measurements: 9.6 x 4.5 x 4.8 cm = volume: 107 mL.
Echogenicity within normal limits. No mass or hydronephrosis
visualized.

Bladder:

Appears normal for degree of bladder distention.
IMPRESSION: Stable complex cyst in the upper pole of the right kidney measuring
approximately 1.2 cm. Consider a 12-18 month follow-up ultrasound to
confirm stability of this finding.

## 2020-03-25 ENCOUNTER — Telehealth: Payer: Self-pay | Admitting: Urology

## 2020-03-25 NOTE — Telephone Encounter (Signed)
Pt LMOM that her U/S is scheduled for 6/25 and she isn't comfortable coming in to office for f/u appt, due to exposure issues.  She wants to know if she can get her U/S results by virtual visit.

## 2020-03-25 NOTE — Telephone Encounter (Signed)
A virtual visit will be fine, but her appointment needs to be either at 8 AM, right before lunch or the last appointment of the evening.

## 2020-03-26 NOTE — Telephone Encounter (Signed)
Spoke to patient and changed the RUS result appointment.

## 2020-04-02 ENCOUNTER — Other Ambulatory Visit: Payer: Self-pay

## 2020-04-02 ENCOUNTER — Ambulatory Visit
Admission: RE | Admit: 2020-04-02 | Discharge: 2020-04-02 | Disposition: A | Discharge status: Home / Self Care | Payer: Medicare Other | Source: Ambulatory Visit | Attending: Urology | Admitting: Urology

## 2020-04-02 ENCOUNTER — Ambulatory Visit: Payer: Medicare Other

## 2020-04-02 DIAGNOSIS — N281 Cyst of kidney, acquired: Secondary | ICD-10-CM | POA: Diagnosis not present

## 2020-04-06 ENCOUNTER — Other Ambulatory Visit: Payer: Self-pay

## 2020-04-06 ENCOUNTER — Ambulatory Visit: Payer: Medicare Other | Admitting: Urology

## 2020-04-06 ENCOUNTER — Encounter: Payer: Self-pay | Admitting: Urology

## 2020-04-06 ENCOUNTER — Telehealth (INDEPENDENT_AMBULATORY_CARE_PROVIDER_SITE_OTHER): Payer: Medicare Other | Admitting: Urology

## 2020-04-06 DIAGNOSIS — N281 Cyst of kidney, acquired: Secondary | ICD-10-CM

## 2020-04-06 NOTE — Progress Notes (Signed)
This service is provided via telemedicine   No vital signs collected/recorded due to the encounter was a telemedicine visit.     Patient consents to a telephone visit:  yes    Names of all persons participating in the telemedicine service and their role in the encounter:  Andrea Hester, [name]

## 2020-04-06 NOTE — Progress Notes (Signed)
Virtual Visit via Telephone Note  I connected with Andrea Hester on 06/29/2021at  3:00 PM EDT by telephone and verified that I am speaking with the correct person using two identifiers.  Location: Patient: Home Provider: Office   I discussed the limitations, risks, security and privacy concerns of performing an evaluation and management service by telephone and the availability of in person appointments. I also discussed with the patient that there may be a patient responsible charge related to this service. The patient expressed understanding and agreed to proceed.   History of Present Illness: Patient with a right complex cyst who was contacted today to discuss her renal ultrasound results.   Observations/Objective: She is answering questions appropriately and is not sounding distressed. CLINICAL DATA:  Complex right renal cyst.  EXAM: RENAL / URINARY TRACT ULTRASOUND COMPLETE  COMPARISON:  April 01, 2019  FINDINGS: Right Kidney:  Renal measurements: 9.6 x 4.4 x 4.9 cm = volume: 108 mL. There is mild right-sided hydronephrosis that appears to resolve postvoid. There is a small 1 x 1.1 x 1.1 cm slightly complex cystic nodule rising from the upper pole.  Left Kidney:  Renal measurements: 9.7 x 4.8 x 5.3 cm = volume: 128 mL. Echogenicity within normal limits. No mass or hydronephrosis visualized.  Bladder:  Appears normal for degree of bladder distention.  Other:  None.  IMPRESSION: Stable, slightly complex cystic lesion arising from the upper pole of the right kidney. Given its stability across multiple prior studies, this is almost certainly a benign lesion requiring no further follow-up.   Electronically Signed   By: Constance Holster M.D.   On: 04/03/2020 15:45 I have independently reviewed the films  Assessment and Plan: 1. Complex right renal cyst Cyst is unchanged across multiple prior studies, so it is most likely a benign  lesion Patient would like discontinue screening at this time and will contact us if she should experience any changes   Follow Up Instructions:   PRN   I discussed the assessment and treatment plan with the patient. The patient was provided an opportunity to ask questions and all were answered. The patient agreed with the plan and demonstrated an understanding of the instructions.   The patient was advised to call back or seek an in-person evaluation if the symptoms worsen or if the condition fails to improve as anticipated.  I provided 10 minutes of non-face-to-face time during this encounter.   I, Joneen Boers Peace, am acting as a Education administrator for [employer].

## 2021-02-25 IMAGING — US US RENAL
1 series · 14 of 25 positions shown · non-contrast
Comparison: April 01, 2019

CLINICAL DATA: Complex right renal cyst.

EXAM:
RENAL / URINARY TRACT ULTRASOUND COMPLETE

[Series 1: us renal · 0.22mm/px · 14 of 50 slices shown]
[im 1/50]
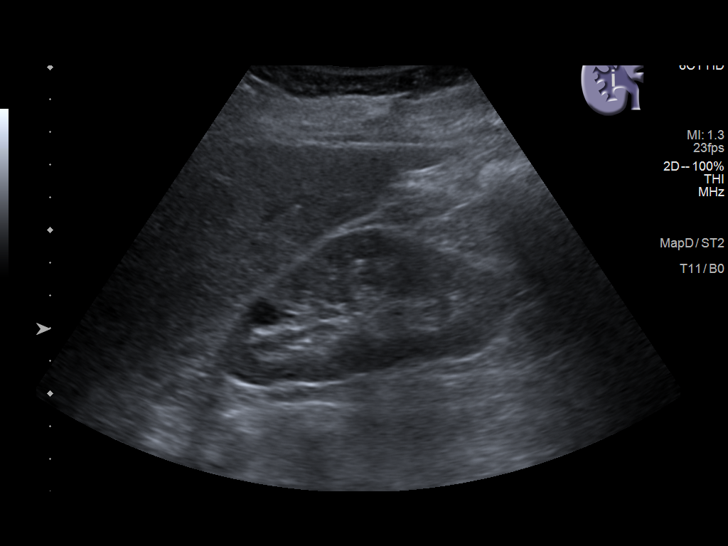
[im 5/50]
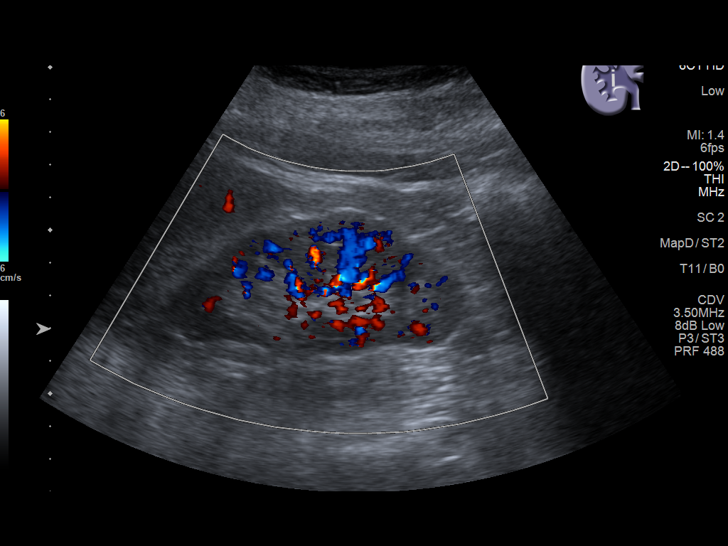
[im 9/50]
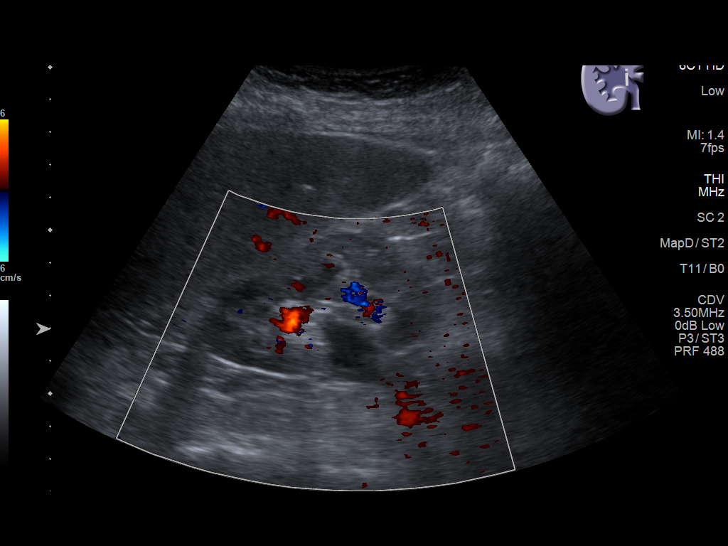
[im 13/50]
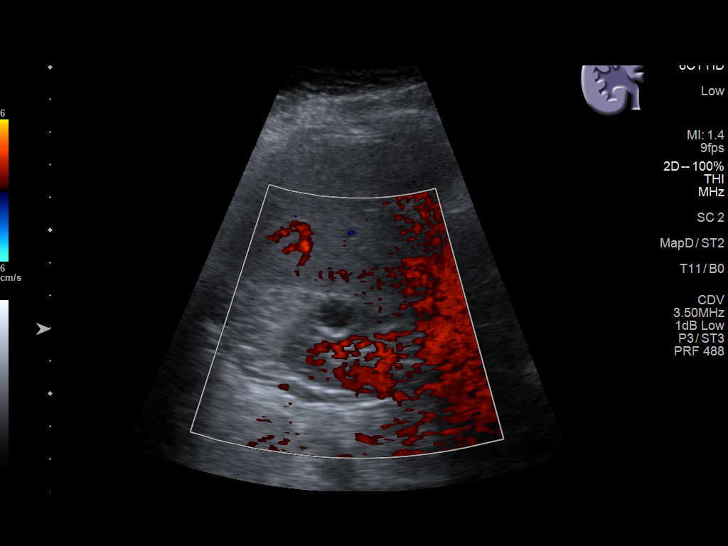
[im 17/50]
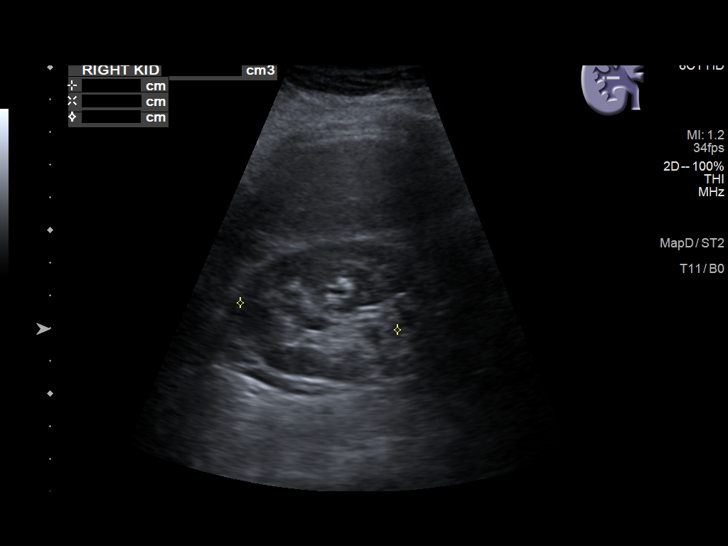
[im 19/50]
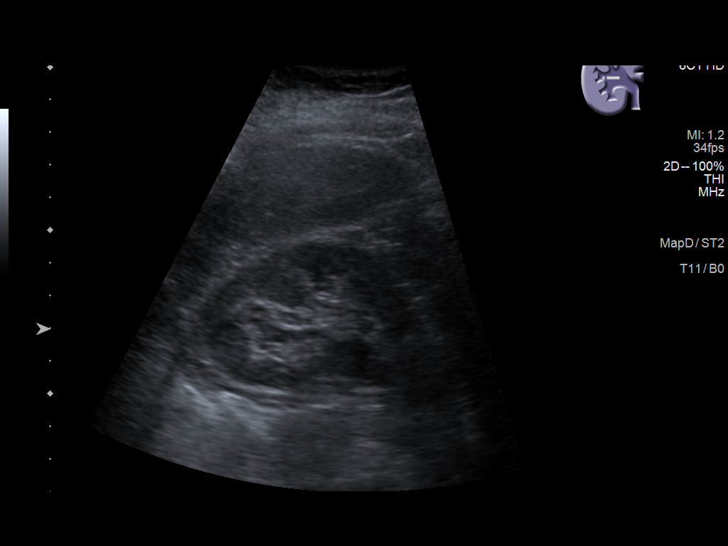
[im 23/50]
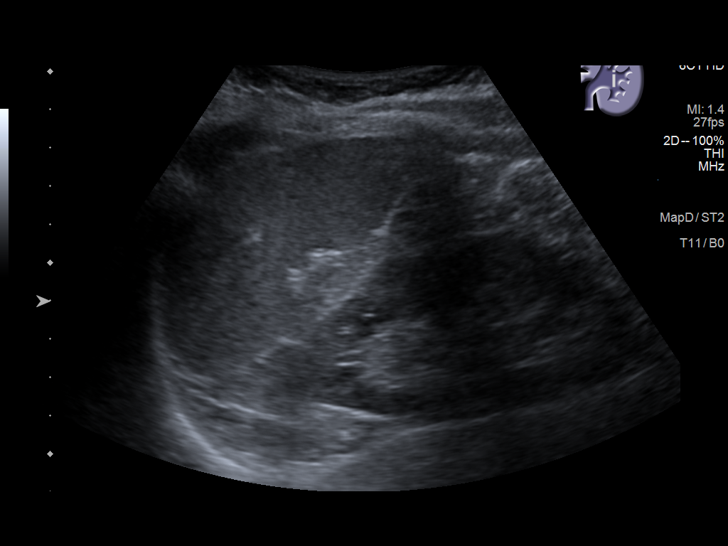
[im 27/50]
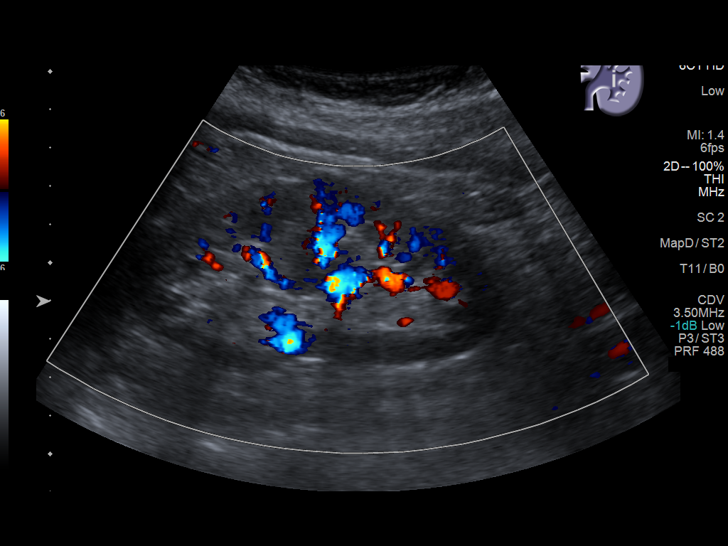
[im 31/50]
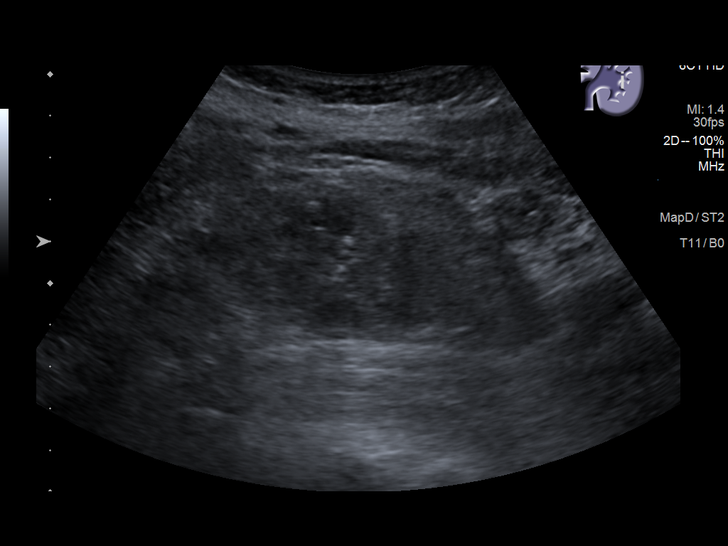
[im 33/50]
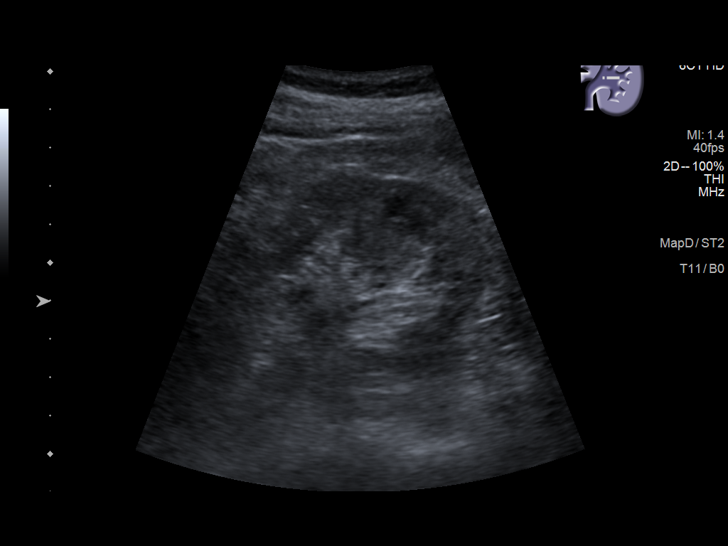
[im 37/50]
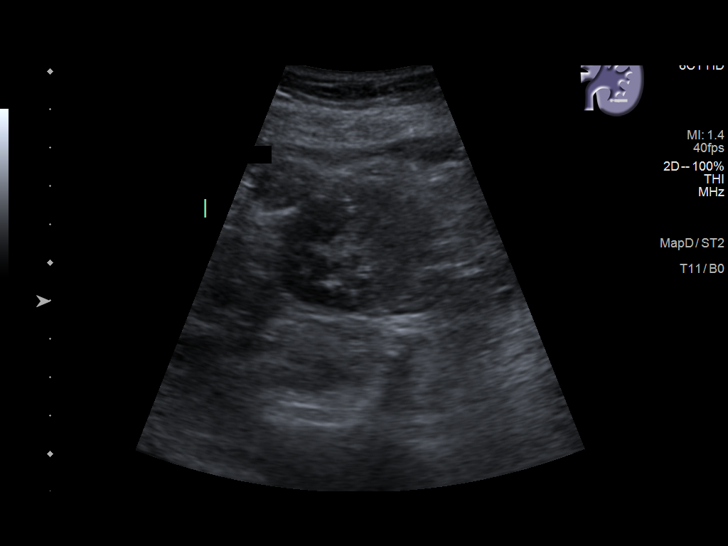
[im 41/50]
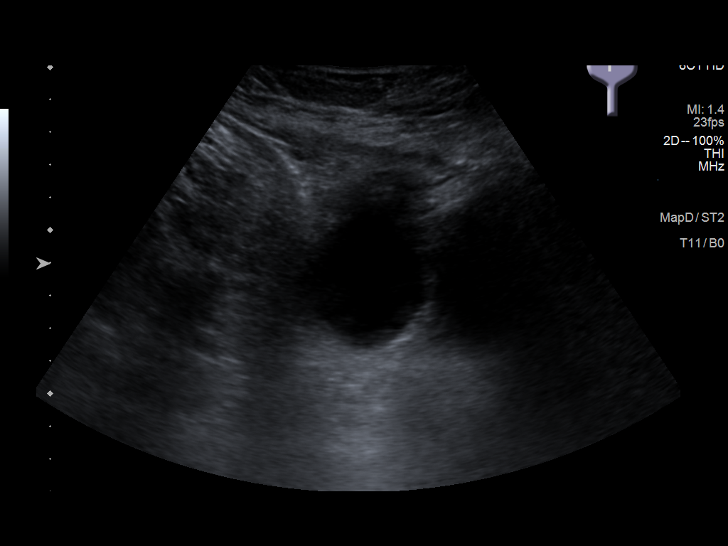
[im 45/50]
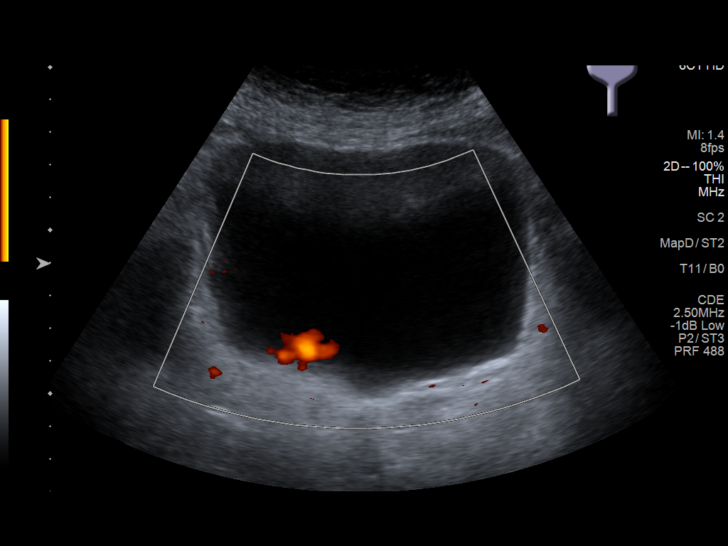
[im 50/50]
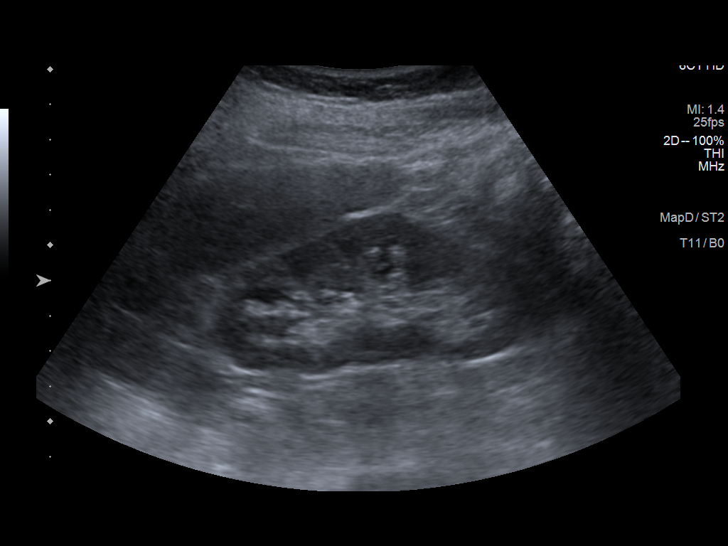

[14 of 25 positions shown; findings below may reference images not displayed]

FINDINGS: Right Kidney:

Renal measurements: 9.6 x 4.4 x 4.9 cm = volume: 108 mL. There is
mild right-sided hydronephrosis that appears to resolve postvoid.
There is a small 1 x 1.1 x 1.1 cm slightly complex cystic nodule
rising from the upper pole.

Left Kidney:

Renal measurements: 9.7 x 4.8 x 5.3 cm = volume: 128 mL.
Echogenicity within normal limits. No mass or hydronephrosis
visualized.

Bladder:

Appears normal for degree of bladder distention.

Other:

None.
IMPRESSION: Stable, slightly complex cystic lesion arising from the upper pole
of the right kidney. Given its stability across multiple prior
studies, this is almost certainly a benign lesion requiring no
further follow-up.

## 2021-08-29 NOTE — Progress Notes (Signed)
08/30/2021 2:47 PM   Andrea Hester 1933/08/28 [id number]  Referring provider: Kirk Ruths, MD [facility] [place],  [place] [address]  Chief Complaint  Patient presents with   Follow-up   Urological history 1. High risk hematuria -non-smoker -RUS 03/2020 - Stable, slightly complex cystic lesion arising from the upper pole of the right kidney. Given its stability across multiple prior studies, this is almost certainly a benign lesion requiring no further follow-up -cysto 08/2018 - NED -no reports of gross heme  2. Urethral caruncle  3. Complex right renal cyst -stable over multiple prior studies -considered benign  HPI: Andrea Hester is a 87 y.o. female who presents today for dryness and polyp on urethra.   She states that last Wednesday she noticed a streak of blood on her toilet paper when she wiped.  She also has been feeling some dryness in the perineal area.  Patient denies any modifying or aggravating factors.  Patient denies any gross hematuria, dysuria or suprapubic/flank pain.  Patient denies any fevers, chills, nausea or vomiting.    She had been on vaginal estrogen cream remotely, but she discontinued it due to vaginal irritation.  PMH: Past Medical History:  Diagnosis Date   Allergy    Anxiety    Arrhythmia    Arthritis    Asthma    GERD (gastroesophageal reflux disease)    Glaucoma    H/O Clostridium difficile infection    High cholesterol    History of palpitations    Hyperlipidemia    Migraines    White coat hypertension     Surgical History: Past Surgical History:  Procedure Laterality Date   ABDOMINAL HYSTERECTOMY     GALLBLADDER SURGERY     TONSILLECTOMY      Home Medications:  Allergies as of 08/30/2021       Reactions   Prednisone Shortness Of Breath   Cardizem [diltiazem Hcl] Other (See Comments)   Could take lower doses, higher doses lower blood pressure too much   Ciprofloxacin  Other (See Comments)   Unsure of reaction   Clindamycin/lincomycin Other (See Comments)   Knee pain   Macrolides And Ketolides Other (See Comments)   Allergic vasculitis   Omnicef [cefdinir] Diarrhea   Penicillins Other (See Comments)   Told she was allergic after an allergic test   Prilosec [omeprazole] Other (See Comments)   migraine   Reglan [metoclopramide] Other (See Comments)   Neck pain   Statins Other (See Comments)   Muscle cramps   Sulfa Antibiotics Other (See Comments)   vasculitis   Epinephrine Palpitations   Latex Rash        Medication List        Accurate as of August 30, 2021  2:47 PM. If you have any questions, ask your nurse or doctor.          aspirin buffered 325 MG Tabs tablet Commonly known as: BUFFERIN Take 325 mg by mouth daily.   diltiazem 30 MG tablet Commonly known as: CARDIZEM Take 1 tablet by mouth daily.   estradiol 0.1 MG/GM vaginal cream Commonly known as: ESTRACE VAGINAL Apply 0.36m (pea-sized amount)  just inside the vaginal introitus with a finger-tip every night for two weeks then on Monday, Wednesday and Friday nights. Started by: SZara Council PA-C   hydrochlorothiazide 12.5 MG tablet Commonly known as: HYDRODIURIL Take 12.5 mg by mouth daily.   Lumigan 0.01 % Soln Generic drug: bimatoprost  Apply to eye.   potassium chloride 10 MEQ tablet Commonly known as: KLOR-CON Take 1 tablet by mouth daily.        Allergies:  Allergies  Allergen Reactions   Prednisone Shortness Of Breath   Cardizem [Diltiazem Hcl] Other (See Comments)    Could take lower doses, higher doses lower blood pressure too much   Ciprofloxacin Other (See Comments)    Unsure of reaction   Clindamycin/Lincomycin Other (See Comments)    Knee pain   Macrolides And Ketolides Other (See Comments)    Allergic vasculitis   Omnicef [Cefdinir] Diarrhea   Penicillins Other (See Comments)    Told she was allergic after an allergic test   Prilosec  [Omeprazole] Other (See Comments)    migraine   Reglan [Metoclopramide] Other (See Comments)    Neck pain   Statins Other (See Comments)    Muscle cramps   Sulfa Antibiotics Other (See Comments)    vasculitis   Epinephrine Palpitations   Latex Rash    Family History: Family History  Problem Relation Age of Onset   Liver cancer Mother     Social History:  reports that she has never smoked. She has never used smokeless tobacco. She reports that she does not drink alcohol and does not use drugs.  ROS: Pertinent ROS in HPI  Physical Exam: BP (!) 208/90   Pulse (!) 108   Ht '5\' 6"'  (1.676 m)   Wt 132 lb (59.9 kg)   BMI 21.31 kg/m   Constitutional:  Well nourished. Alert and oriented, No acute distress. HEENT: [place] AT, mask in place.  Trachea midline Cardiovascular: No clubbing, cyanosis, or edema. Respiratory: Normal respiratory effort, no increased work of breathing. GU: No CVA tenderness.  No bladder fullness or masses.  Atrophic external genitalia, normal pubic hair distribution, no lesions.  Urethral caruncle noted.  No bladder fullness, tenderness or masses. Pale vagina mucosa, poor estrogen effect, no discharge, no lesions. Anus and perineum are without rashes or lesions.     Neurologic: Grossly intact, no focal deficits, moving all 4 extremities. Psychiatric: Normal mood and affect.    Laboratory Data: Hemoglobin A1C 4.2 - 5.6 % 5.6   Average Blood Glucose (Calc) mg/dL [place] - LAB  Narrative Performed by [facility] - LAB Normal Range:    4.2 - 5.6%  Increased Risk:  5.7 - 6.4%  Diabetes:        >= 6.5%  Glycemic Control for adults with diabetes:  <7%   Specimen Collected: 02/02/21 09:56 Last Resulted: 02/02/21 11:40  Received From: [place]  Result Received: 08/24/21 11:47   Cholesterol, Total 100 - 200 mg/dL 258 High    Triglyceride 35 - 199 mg/dL 84   HDL (High Density Lipoprotein) Cholesterol  35.0 - 85.0 mg/dL 81.5   LDL Calculated 0 - 130 mg/dL 160 High    VLDL Cholesterol mg/dL 17   Cholesterol/HDL Ratio  3.2   Resulting Agency  [place] [facility] - LAB  Specimen Collected: 02/02/21 09:56 Last Resulted: 02/02/21 12:23  Received From: [place]  Result Received: 08/24/21 11:47   Glucose 70 - 110 mg/dL 99   Sodium 136 - 145 mmol/L 142   Potassium 3.6 - 5.1 mmol/L 4.3   Chloride 97 - 109 mmol/L 106   Carbon Dioxide (CO2) 22.0 - 32.0 mmol/L 27.1   Urea Nitrogen (BUN) 7 - 25 mg/dL 20   Creatinine  0.6 - 1.1 mg/dL 0.7   Glomerular Filtration Rate (eGFR), MDRD Estimate >60 mL/min/1.73sq m 80   Calcium 8.7 - 10.3 mg/dL 9.7   AST  8 - 39 U/L 25   ALT  5 - 38 U/L 20   Alk Phos (alkaline Phosphatase) 34 - 104 U/L 43   Albumin 3.5 - 4.8 g/dL 4.6   Bilirubin, Total 0.3 - 1.2 mg/dL 0.5   Protein, Total 6.1 - 7.9 g/dL 7.3   A/G Ratio 1.0 - 5.0 gm/dL 1.7   Resulting Agency  [place] - LAB  Specimen Collected: 02/02/21 09:56 Last Resulted: 02/02/21 12:23  Received From: [place]  Result Received: 08/24/21 11:47  I have reviewed the labs.   Pertinent Imaging: N/A  Assessment & Plan:    1. Urethral caruncle -We will restart the vaginal estrogen cream at this time.  I have sent a prescription for Estrace to see if this is more tolerable.  She will apply a small pea-sized amount with the tip of her finger to the carbuncle every night for 2 weeks  2. Vaginal atrophy -apply 0.79m (pea-sized amount)  just inside the vaginal introitus with a finger-tip on Monday, Wednesday and Friday nights   Return in about 2 weeks (around 09/13/2021) for recheck on urethral caruncle .  These notes generated with voice recognition software. I apologize for typographical errors.  SZara Council PA-C  [facility] [address] [place] [phone]

## 2021-08-30 ENCOUNTER — Other Ambulatory Visit: Payer: Self-pay

## 2021-08-30 ENCOUNTER — Ambulatory Visit: Payer: Medicare Other | Admitting: Urology

## 2021-08-30 ENCOUNTER — Encounter: Payer: Self-pay | Admitting: Urology

## 2021-08-30 VITALS — BP 208/90 | HR 108 | Ht 66.0 in | Wt 132.0 lb

## 2021-08-30 DIAGNOSIS — R319 Hematuria, unspecified: Secondary | ICD-10-CM | POA: Diagnosis not present

## 2021-08-30 DIAGNOSIS — N362 Urethral caruncle: Secondary | ICD-10-CM

## 2021-08-30 DIAGNOSIS — N952 Postmenopausal atrophic vaginitis: Secondary | ICD-10-CM

## 2021-08-30 MED ORDER — ESTRADIOL 0.1 MG/GM VA CREA: TOPICAL_CREAM | VAGINAL | 12 refills | Status: AC

## 2021-08-30 NOTE — Addendum Note (Signed)
Addended by: Kyra Manges on: 08/30/2021 03:02 PM   Modules accepted: Orders

## 2021-09-05 ENCOUNTER — Telehealth: Payer: Self-pay

## 2021-09-05 NOTE — Telephone Encounter (Signed)
Pt LM on triage line stating that she believes her vaginal estrogen cream is causing her to have redness around her eyes and dark circles underneath. Patient states that she read on the Internet that this can happen with hormone replacement therapy. She questions if she should stay on the medication. Please advise.

## 2021-09-06 NOTE — Telephone Encounter (Signed)
Patient states she use a peas size of cream , once a night. She states she is going to stop using the cream because of the side efforts.

## 2021-09-13 ENCOUNTER — Ambulatory Visit: Payer: Medicare Other | Admitting: Urology

## 2021-09-14 ENCOUNTER — Ambulatory Visit: Payer: Medicare Other | Admitting: Urology

## 2023-01-18 ENCOUNTER — Ambulatory Visit (INDEPENDENT_AMBULATORY_CARE_PROVIDER_SITE_OTHER): Payer: Medicare Other | Admitting: Dermatology

## 2023-01-18 VITALS — BP 160/82

## 2023-01-18 DIAGNOSIS — L821 Other seborrheic keratosis: Secondary | ICD-10-CM

## 2023-01-18 DIAGNOSIS — L820 Inflamed seborrheic keratosis: Secondary | ICD-10-CM

## 2023-01-18 DIAGNOSIS — L578 Other skin changes due to chronic exposure to nonionizing radiation: Secondary | ICD-10-CM

## 2023-01-18 NOTE — Progress Notes (Signed)
   New Patient Visit   Subjective  Andrea Hester is a 89 y.o. female who presents for the following: check spots, scalp, just noticed, tender and crusty, spot on R abdomen, just noticed no symptoms The patient has spots, moles and lesions to be evaluated, some may be new or changing and the patient has concerns that these could be cancer.  The following portions of the chart were reviewed this encounter and updated as appropriate: medications, allergies, medical history  Review of Systems:  No other skin or systemic complaints except as noted in HPI or Assessment and Plan.  Objective  Well appearing patient in no apparent distress; mood and affect are within normal limits.  A focused examination was performed of the following areas: Scalp, abdomen, arms, hands  Relevant exam findings are noted in the Assessment and Plan.   Assessment & Plan   INFLAMED SEBORRHEIC KERATOSIS Exam: Erythematous keratotic or waxy stuck-on papule or plaque.  Symptomatic, irritating, patient would like treated.  Benign-appearing.  Call clinic for new or changing lesions.   Prior to procedure, discussed risks of blister formation, small wound, skin dyspigmentation, or rare scar following treatment. Recommend Vaseline ointment to treated areas while healing.  Destruction Procedure Note Destruction method: cryotherapy   Informed consent: discussed and consent obtained   Lesion destroyed using liquid nitrogen: Yes   Outcome: patient tolerated procedure well with no complications   Post-procedure details: wound care instructions given   Locations: R scalp x 1, R inframammary x 1 # of Lesions Treated: 2  SEBORRHEIC KERATOSIS - Stuck-on, waxy, tan-brown papules and/or plaques  - Benign-appearing - Discussed benign etiology and prognosis. - Observe - Call for any changes  ACTINIC DAMAGE - chronic, secondary to cumulative UV radiation exposure/sun exposure over time - diffuse scaly erythematous  macules with underlying dyspigmentation - Recommend daily broad spectrum sunscreen SPF 30+ to sun-exposed areas, reapply every 2 hours as needed.  - Recommend staying in the shade or wearing long sleeves, sun glasses (UVA+UVB protection) and wide brim hats (4-inch brim around the entire circumference of the hat). - Call for new or changing lesions.    Return in about 4 months (around 05/20/2023) for recheck ISKs.  I, Andrea Hester, RMA, am acting as scribe for Andrea Sans, MD .  Documentation: I have reviewed the above documentation for accuracy and completeness, and I agree with the above.  Andrea Sans, MD

## 2023-01-18 NOTE — Patient Instructions (Addendum)
Cryotherapy Aftercare  Wash gently with soap and water everyday.   Apply Vaseline and Band-Aid daily until healed.     Due to recent changes in healthcare laws, you may see results of your pathology and/or laboratory studies on MyChart before the doctors have had a chance to review them. We understand that in some cases there may be results that are confusing or concerning to you. Please understand that not all results are received at the same time and often the doctors may need to interpret multiple results in order to provide you with the best plan of care or course of treatment. Therefore, we ask that you please give us 2 business days to thoroughly review all your results before contacting the office for clarification. Should we see a critical lab result, you will be contacted sooner.   If You Need Anything After Your Visit  If you have any questions or concerns for your doctor, please call our main line at [phone] and press option 4 to reach your doctor's medical assistant. If no one answers, please leave a voicemail as directed and we will return your call as soon as possible. Messages left after 4 pm will be answered the following business day.   You may also send us a message via MyChart. We typically respond to MyChart messages within 1-2 business days.  For prescription refills, please ask your pharmacy to contact our office. Our fax number is [phone].  If you have an urgent issue when the clinic is closed that cannot wait until the next business day, you can page your doctor at the number below.    Please note that while we do our best to be available for urgent issues outside of office hours, we are not available 24/7.   If you have an urgent issue and are unable to reach us, you may choose to seek medical care at your doctor's office, retail clinic, urgent care center, or emergency room.  If you have a medical emergency, please immediately call 911 or go to the  emergency department.  Pager Numbers  - Dr. Kowalski: [phone]  - Dr. Moye: [phone]  - Dr. Stewart: [phone]  In the event of inclement weather, please call our main line at [phone] for an update on the status of any delays or closures.  Dermatology Medication Tips: Please keep the boxes that topical medications come in in order to help keep track of the instructions about where and how to use these. Pharmacies typically print the medication instructions only on the boxes and not directly on the medication tubes.   If your medication is too expensive, please contact our office at [phone] option 4 or send us a message through MyChart.   We are unable to tell what your co-pay for medications will be in advance as this is different depending on your insurance coverage. However, we may be able to find a substitute medication at lower cost or fill out paperwork to get insurance to cover a needed medication.   If a prior authorization is required to get your medication covered by your insurance company, please allow us 1-2 business days to complete this process.  Drug prices often vary depending on where the prescription is filled and some pharmacies may offer cheaper prices.  The website www.goodrx.com contains coupons for medications through different pharmacies. The prices here do not account for what the cost may be with help from insurance (it may be cheaper with your insurance), but the website can   give you the price if you did not use any insurance.  - You can print the associated coupon and take it with your prescription to the pharmacy.  - You may also stop by our office during regular business hours and pick up a GoodRx coupon card.  - If you need your prescription sent electronically to a different pharmacy, notify our office through [facility] MyChart or by phone at [phone] option 4.     Si Usted Necesita Algo Despus de Su Visita  Tambin puede  enviarnos un mensaje a travs de MyChart. Por lo general respondemos a los mensajes de MyChart en el transcurso de 1 a 2 das hbiles.  Para renovar recetas, por favor pida a su farmacia que se ponga en contacto con nuestra oficina. Nuestro nmero de fax es el [phone].  Si tiene un asunto urgente cuando la clnica est cerrada y que no puede esperar hasta el siguiente da hbil, puede llamar/localizar a su doctor(a) al nmero que aparece a continuacin.   Por favor, tenga en cuenta que aunque hacemos todo lo posible para estar disponibles para asuntos urgentes fuera del horario de oficina, no estamos disponibles las 24 horas del da, los 7 das de la semana.   Si tiene un problema urgente y no puede comunicarse con nosotros, puede optar por buscar atencin mdica  en el consultorio de su doctor(a), en una clnica privada, en un centro de atencin urgente o en una sala de emergencias.  Si tiene una emergencia mdica, por favor llame inmediatamente al 911 o vaya a la sala de emergencias.  Nmeros de bper  - Dr. Kowalski: [phone]  - Dra. Moye: [phone]  - Dra. Stewart: [phone]  En caso de inclemencias del tiempo, por favor llame a nuestra lnea principal al [phone] para una actualizacin sobre el estado de cualquier retraso o cierre.  Consejos para la medicacin en dermatologa: Por favor, guarde las cajas en las que vienen los medicamentos de uso tpico para ayudarle a seguir las instrucciones sobre dnde y cmo usarlos. Las farmacias generalmente imprimen las instrucciones del medicamento slo en las cajas y no directamente en los tubos del medicamento.   Si su medicamento es muy caro, por favor, pngase en contacto con nuestra oficina llamando al [phone] y presione la opcin 4 o envenos un mensaje a travs de MyChart.   No podemos decirle cul ser su copago por los medicamentos por adelantado ya que esto es diferente dependiendo de la cobertura de su seguro.  Sin embargo, es posible que podamos encontrar un medicamento sustituto a menor costo o llenar un formulario para que el seguro cubra el medicamento que se considera necesario.   Si se requiere una autorizacin previa para que su compaa de seguros cubra su medicamento, por favor permtanos de 1 a 2 das hbiles para completar este proceso.  Los precios de los medicamentos varan con frecuencia dependiendo del lugar de dnde se surte la receta y alguna farmacias pueden ofrecer precios ms baratos.  El sitio web www.goodrx.com tiene cupones para medicamentos de diferentes farmacias. Los precios aqu no tienen en cuenta lo que podra costar con la ayuda del seguro (puede ser ms barato con su seguro), pero el sitio web puede darle el precio si no utiliz ningn seguro.  - Puede imprimir el cupn correspondiente y llevarlo con su receta a la farmacia.  - Tambin puede pasar por nuestra oficina durante el horario de atencin regular y recoger una tarjeta de cupones de GoodRx.  -   Si necesita que su receta se enve electrnicamente a una farmacia diferente, informe a nuestra oficina a travs de MyChart de [place] o por telfono llamando al [phone] y presione la opcin 4.  

## 2023-02-04 ENCOUNTER — Encounter: Payer: Self-pay | Admitting: Dermatology

## 2023-05-31 ENCOUNTER — Ambulatory Visit: Payer: Medicare Other | Admitting: Dermatology

## 2024-11-11 ENCOUNTER — Ambulatory Visit: Admit: 2024-11-11 | Admitting: Ophthalmology

## 2024-11-11 SURGERY — PHACOEMULSIFICATION, CATARACT, WITH IOL INSERTION
Anesthesia: Topical | Laterality: Left

## 2024-11-25 ENCOUNTER — Ambulatory Visit: Admit: 2024-11-25 | Admitting: Ophthalmology

## 2024-11-25 SURGERY — PHACOEMULSIFICATION, CATARACT, WITH IOL INSERTION
Anesthesia: Topical | Laterality: Right
